# Patient Record
Sex: Male | Born: 1950 | Race: White | Hispanic: No | Marital: Married | Smoking: Never smoker
Health system: Southern US, Community
[De-identification: ages and names within clinical notes are randomized; demographics above are authoritative.]

## PROBLEM LIST (undated history)

## (undated) DIAGNOSIS — M779 Enthesopathy, unspecified: Secondary | ICD-10-CM

## (undated) DIAGNOSIS — I10 Essential (primary) hypertension: Secondary | ICD-10-CM

## (undated) DIAGNOSIS — K469 Unspecified abdominal hernia without obstruction or gangrene: Secondary | ICD-10-CM

## (undated) DIAGNOSIS — E785 Hyperlipidemia, unspecified: Secondary | ICD-10-CM

## (undated) DIAGNOSIS — R972 Elevated prostate specific antigen [PSA]: Secondary | ICD-10-CM

## (undated) DIAGNOSIS — T7840XA Allergy, unspecified, initial encounter: Secondary | ICD-10-CM

## (undated) HISTORY — PX: TONSILLECTOMY AND ADENOIDECTOMY: SUR1326

## (undated) HISTORY — PX: KNEE SURGERY: SHX244

## (undated) HISTORY — DX: Hyperlipidemia, unspecified: E78.5

## (undated) HISTORY — DX: Unspecified abdominal hernia without obstruction or gangrene: K46.9

## (undated) HISTORY — PX: KNEE ARTHROSCOPY: SUR90

## (undated) HISTORY — DX: Allergy, unspecified, initial encounter: T78.40XA

## (undated) HISTORY — DX: Elevated prostate specific antigen (PSA): R97.20

## (undated) HISTORY — DX: Enthesopathy, unspecified: M77.9

## (undated) HISTORY — DX: Essential (primary) hypertension: I10

---

## 1995-03-26 DIAGNOSIS — I10 Essential (primary) hypertension: Secondary | ICD-10-CM | POA: Insufficient documentation

## 2005-03-02 ENCOUNTER — Inpatient Hospital Stay (HOSPITAL_COMMUNITY): Admission: RE | Admit: 2005-03-02 | Discharge: 2005-03-03 | Payer: Self-pay | Admitting: Neurosurgery

## 2005-05-27 ENCOUNTER — Encounter: Admission: RE | Admit: 2005-05-27 | Discharge: 2005-05-27 | Payer: Self-pay | Admitting: Neurosurgery

## 2005-09-15 DIAGNOSIS — E7800 Pure hypercholesterolemia, unspecified: Secondary | ICD-10-CM | POA: Insufficient documentation

## 2006-03-16 HISTORY — PX: BACK SURGERY: SHX140

## 2006-04-29 ENCOUNTER — Ambulatory Visit: Payer: Self-pay | Admitting: Internal Medicine

## 2006-10-29 IMAGING — RF DG LUMBAR SPINE 2-3V
1 series · 2 of 2 positions shown · non-contrast
Comparison: none

CLINICAL DATA: Patient has undergone fusion. 
 LUMBAR SPINE (2-3 VIEWS): 
 Intraoperative views of the lumbar spine show posterior pedicle screw plate fixation L5-S1.  Hardware is well positioned.  On the lateral projection, interbody cages at the L5-S1 level appear well positioned.

[Series 1: run · 2 of 2 slices shown]
[im 1/2]
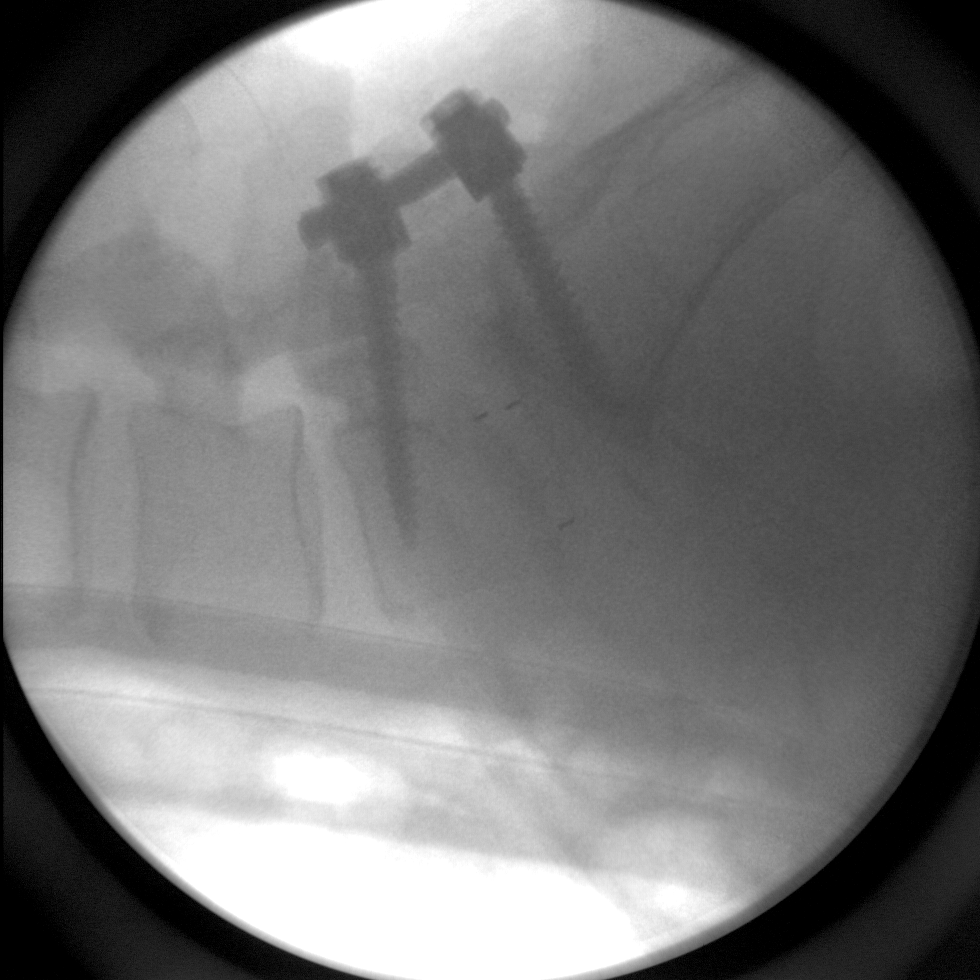
[im 2/2]
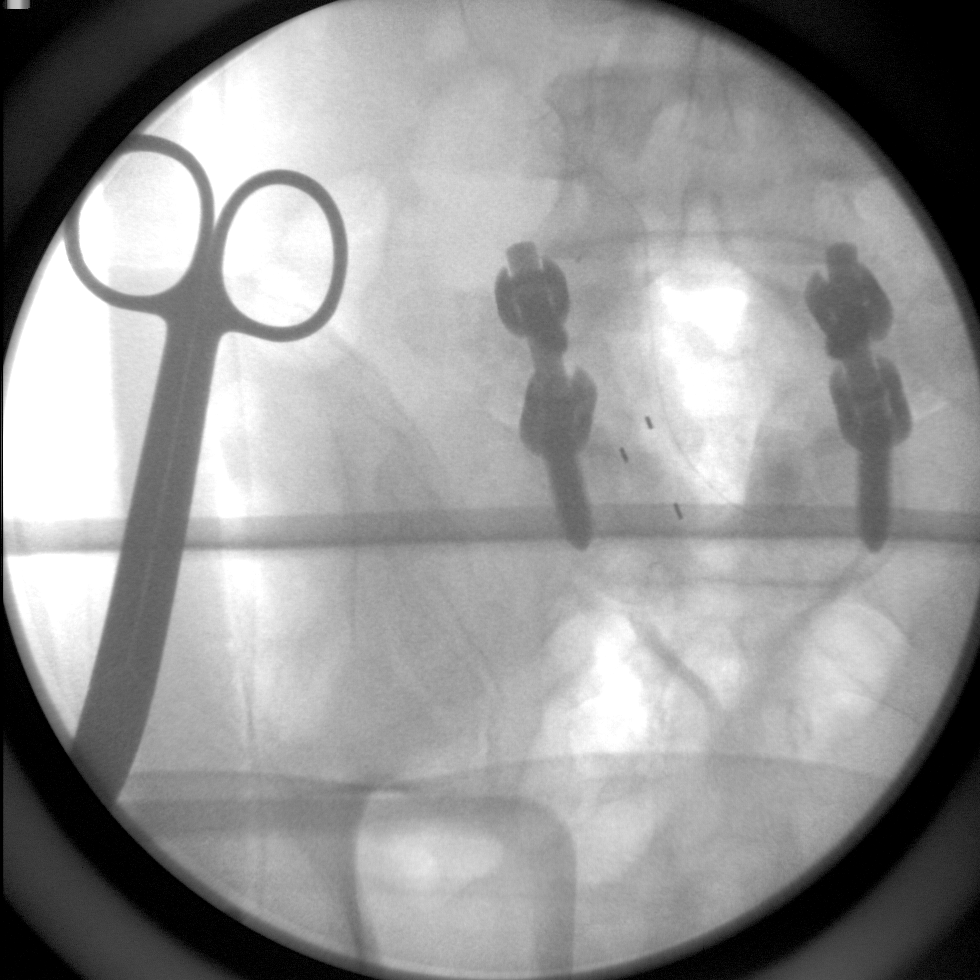

[2 of 2 positions shown; findings below may reference images not displayed]

IMPRESSION: Fusion L5-S1.  Hardware well positioned.

## 2009-04-10 ENCOUNTER — Ambulatory Visit: Payer: Self-pay | Admitting: Family Medicine

## 2012-03-16 DIAGNOSIS — K469 Unspecified abdominal hernia without obstruction or gangrene: Secondary | ICD-10-CM

## 2012-03-16 HISTORY — DX: Unspecified abdominal hernia without obstruction or gangrene: K46.9

## 2012-06-29 ENCOUNTER — Telehealth: Payer: Self-pay | Admitting: *Deleted

## 2012-06-29 DIAGNOSIS — Z1211 Encounter for screening for malignant neoplasm of colon: Secondary | ICD-10-CM

## 2012-06-29 MED ORDER — POLYETHYLENE GLYCOL 3350 17 GM/SCOOP PO POWD: ORAL | Status: DC

## 2012-06-29 NOTE — Telephone Encounter (Signed)
Patient was contacted today to arrange for colonoscopy and right inguinal hernia repair. This has been arranged at [facility] for 09-07-12 due to patient's request. He is aware of all instructions. Miralax prescription has been sent to the patient's pharmacy.

## 2012-08-24 ENCOUNTER — Other Ambulatory Visit: Payer: Self-pay | Admitting: General Surgery

## 2012-08-24 DIAGNOSIS — Z1211 Encounter for screening for malignant neoplasm of colon: Secondary | ICD-10-CM

## 2012-08-24 DIAGNOSIS — K4090 Unilateral inguinal hernia, without obstruction or gangrene, not specified as recurrent: Secondary | ICD-10-CM

## 2012-08-31 ENCOUNTER — Ambulatory Visit (INDEPENDENT_AMBULATORY_CARE_PROVIDER_SITE_OTHER): Payer: BC Managed Care – PPO | Admitting: General Surgery

## 2012-08-31 ENCOUNTER — Encounter: Payer: Self-pay | Admitting: General Surgery

## 2012-08-31 VITALS — BP 158/82 | HR 68 | Resp 12 | Ht 69.0 in | Wt 190.0 lb

## 2012-08-31 DIAGNOSIS — K4090 Unilateral inguinal hernia, without obstruction or gangrene, not specified as recurrent: Secondary | ICD-10-CM

## 2012-08-31 DIAGNOSIS — Z1211 Encounter for screening for malignant neoplasm of colon: Secondary | ICD-10-CM

## 2012-08-31 NOTE — Progress Notes (Signed)
Patient ID: MARTON MALIZIA, male   DOB: 05/31/1950, 62 y.o.   MRN: [id number]  Chief Complaint  Patient presents with  . Pre-op Exam    colonoscopy    HPI Warren Jackson is a 62 y.o. male who presents for a pre-op for planned inguinal hernia and colonoscopy evaluation. The patient states no problems with his bowels. No known family history of colon cancer or polyps.  Marland KitchenHPI  Past Medical History  Diagnosis Date  . Hyperlipidemia   . Bone spur   . Hernia 2014    Past Surgical History  Procedure Laterality Date  . Knee surgery Left [id number]  . Back surgery  2008    History reviewed. No pertinent family history.  Social History History  Substance Use Topics  . Smoking status: Never Smoker   . Smokeless tobacco: Not on file  . Alcohol Use: Yes    Allergies  Allergen Reactions  . Penicillins Rash    Current Outpatient Prescriptions  Medication Sig Dispense Refill  . ADVICOR 1000-20 MG 24 hr tablet Take 1 tablet by mouth daily.      Marland Kitchen aspirin 81 MG tablet Take 81 mg by mouth daily.      . DiphenhydrAMINE HCl, Sleep, (NIGHTTIME SLEEP AID) 50 MG CAPS Take 1 capsule by mouth at bedtime as needed.      Marland Kitchen lisinopril-hydrochlorothiazide (PRINZIDE,ZESTORETIC) 10-12.5 MG per tablet Take 1 tablet by mouth daily.      . metoprolol succinate (TOPROL-XL) 50 MG 24 hr tablet Take 0.5 tablets by mouth daily.      . Multiple Vitamin (MULTIVITAMIN) tablet Take 1 tablet by mouth daily.      . ranitidine (ZANTAC) 150 MG capsule Take 150 mg by mouth 2 (two) times daily.       No current facility-administered medications for this visit.    Review of Systems Review of Systems  Constitutional: Negative.   Respiratory: Negative.   Cardiovascular: Negative.   Gastrointestinal: Negative.     Blood pressure 158/82, pulse 68, resp. rate 12, height 5\' 9"  (1.753 m), weight 190 lb (86.183 kg).  Physical Exam Physical Exam  Constitutional: He appears well-developed and well-nourished.   Neck: Trachea normal. No mass and no thyromegaly present.  Cardiovascular: Normal rate, regular rhythm, normal heart sounds and normal pulses.   No murmur heard. Pulmonary/Chest: Effort normal and breath sounds normal.  Abdominal: A hernia is present. Hernia confirmed positive in the right inguinal area.  Neurological: He is alert.  Skin: Skin is warm and dry.    Data Reviewed Laboratory data from 12/19/2011 is unremarkable.  Assessment    Symptomatic right inguinal hernia.  Candidate for screening colonoscopy.    Plan    Surgery for repair of his right inguinal hernia and screening colonoscopy is scheduled for 09/07/2012. The indications for the procedure has been reviewed with the patient. The risks and benefits of colonoscopy including those related to bleeding and perforation have been reviewed. The plan for prosthetic mesh placement has been discussed.       Earline Mayotte 09/01/2012, 12:09 PM

## 2012-08-31 NOTE — Patient Instructions (Addendum)
Patient to have hernia repair as well as screening colonoscopy.    Colonoscopy A colonoscopy is an exam to evaluate your entire colon. In this exam, your colon is cleansed. A long fiberoptic tube is inserted through your rectum and into your colon. The fiberoptic scope (endoscope) is a long bundle of enclosed and very flexible fibers. These fibers transmit light to the area examined and send images from that area to your caregiver. Discomfort is usually minimal. You may be given a drug to help you sleep (sedative) during or prior to the procedure. This exam helps to detect lumps (tumors), polyps, inflammation, and areas of bleeding. Your caregiver may also take a small piece of tissue (biopsy) that will be examined under a microscope. LET YOUR CAREGIVER KNOW ABOUT:   Allergies to food or medicine.  Medicines taken, including vitamins, herbs, eyedrops, over-the-counter medicines, and creams.  Use of steroids (by mouth or creams).  Previous problems with anesthetics or numbing medicines.  History of bleeding problems or blood clots.  Previous surgery.  Other health problems, including diabetes and kidney problems.  Possibility of pregnancy, if this applies. BEFORE THE PROCEDURE   A clear liquid diet may be required for 2 days before the exam.  Ask your caregiver about changing or stopping your regular medications.  Liquid injections (enemas) or laxatives may be required.  A large amount of electrolyte solution may be given to you to drink over a short period of time. This solution is used to clean out your colon.  You should be present 60 minutes prior to your procedure or as directed by your caregiver. AFTER THE PROCEDURE   If you received a sedative or pain relieving medication, you will need to arrange for someone to drive you home.  Occasionally, there is a little blood passed with the first bowel movement. Do not be concerned. FINDING OUT THE RESULTS OF YOUR TEST Not all test  results are available during your visit. If your test results are not back during the visit, make an appointment with your caregiver to find out the results. Do not assume everything is normal if you have not heard from your caregiver or the medical facility. It is important for you to follow up on all of your test results. HOME CARE INSTRUCTIONS   It is not unusual to pass moderate amounts of gas and experience mild abdominal cramping following the procedure. This is due to air being used to inflate your colon during the exam. Walking or a warm pack on your belly (abdomen) may help.  You may resume all normal meals and activities after sedatives and medicines have worn off.  Only take over-the-counter or prescription medicines for pain, discomfort, or fever as directed by your caregiver. Do not use aspirin or blood thinners if a biopsy was taken. Consult your caregiver for medicine usage if biopsies were taken. SEEK IMMEDIATE MEDICAL CARE IF:   You have a fever.  You pass large blood clots or fill a toilet with blood following the procedure. This may also occur 10 to 14 days following the procedure. This is more likely if a biopsy was taken.  You develop abdominal pain that keeps getting worse and cannot be relieved with medicine. Document Released: 02/28/2000 Document Revised: 05/25/2011 Document Reviewed: 10/13/2007 [facility] Patient Information 2014 [place], [place].   Hernia, Surgical Repair A hernia occurs when an internal organ pushes out through a weak spot in the belly (abdominal) wall muscles. Hernias commonly occur in the groin and around  the navel. Hernias often can be pushed back into place (reduced). Most hernias tend to get worse over time. Problems occur when abdominal contents get stuck in the opening (incarcerated hernia). The blood supply gets cut off (strangulated hernia). This is an emergency and needs surgery. Otherwise, hernia repair can be an elective procedure. This means  you can schedule this at your convenience when an emergency is not present. Because complications can occur, if you decide to repair the hernia, it is best to do it soon. When it becomes an emergency procedure, there is increased risk of complications after surgery. CAUSES   Heavy lifting.  Obesity.  Prolonged coughing.  Straining to move your bowels.  Hernias can also occur through a cut (incision) by a surgeonafter an abdominal operation. HOME CARE INSTRUCTIONS Before the repair:  Bed rest is not required. You may continue your normal activities, but avoid heavy lifting (more than 10 pounds) or straining. Cough gently. If you are a smoker, it is best to stop. Even the best hernia repair can break down with the continual strain of coughing.  Do not wear anything tight over your hernia. Do not try to keep it in with an outside bandage or truss. These can damage abdominal contents if they are trapped in the hernia sac.  Eat a normal diet. Avoid constipation. Straining over long periods of time to have a bowel movement will increase hernia size. It also can breakdown repairs. If you cannot do this with diet alone, laxatives or stool softeners may be used. PRIOR TO SURGERY, SEEK IMMEDIATE MEDICAL CARE IF: You have problems (symptoms) of a trapped (incarcerated) hernia. Symptoms include:  An oral temperature above 102 F (38.9 C) develops, or as your caregiver suggests.  Increasing abdominal pain.  Feeling sick to your stomach(nausea) and vomiting.  You stop passing gas or stool.  The hernia is stuck outside the abdomen, looks discolored, feels hard, or is tender.  You have any changes in your bowel habits or in the hernia that is unusual for you. LET YOUR CAREGIVERS KNOW ABOUT THE FOLLOWING:  Allergies.  Medications taken including herbs, eye drops, over the counter medications, and creams.  Use of steroids (by mouth or creams).  Family or personal history of problems with  anesthetics or Novocaine.  Possibility of pregnancy, if this applies.  Personal history of blood clots (thrombophlebitis).  Family or personal history of bleeding or blood problems.  Previous surgery.  Other health problems. BEFORE THE PROCEDURE You should be present 1 hour prior to your procedure, or as directed by your caregiver.  AFTER THE PROCEDURE After surgery, you will be taken to the recovery area. A nurse will watch and check your progress there. Once you are awake, stable, and taking fluids well, you will be allowed to go home as long as there are no problems. Once home, an ice pack (wrapped in a light towel) applied to your operative site may help with discomfort. It may also keep the swelling down. Do not lift anything heavier than 10 pounds (4.55 kilograms). Take showers not baths. Do not drive while taking narcotics. Follow instructions as suggested by your caregiver.  SEEK IMMEDIATE MEDICAL CARE IF: After surgery:  There is redness, swelling, or increasing pain in the wound.  There is pus coming from the wound.  There is drainage from a wound lasting longer than 1 day.  An unexplained oral temperature above 102 F (38.9 C) develops.  You notice a foul smell coming from the  wound or dressing.  There is a breaking open of a wound (edged not staying together) after the sutures have been removed.  You notice increasing pain in the shoulders (shoulder strap areas).  You develop dizzy episodes or fainting while standing.  You develop persistent nausea or vomiting.  You develop a rash.  You have difficulty breathing.  You develop any reaction or side effects to medications given. MAKE SURE YOU:   Understand these instructions.  Will watch your condition.  Will get help right away if you are not doing well or get worse. Document Released: 08/26/2000 Document Revised: 05/25/2011 Document Reviewed: 07/19/2007 [facility] Patient Information 2014 [place],  [place].

## 2012-09-01 ENCOUNTER — Encounter: Payer: Self-pay | Admitting: General Surgery

## 2012-09-07 ENCOUNTER — Ambulatory Visit: Payer: Self-pay | Admitting: General Surgery

## 2012-09-07 DIAGNOSIS — Z1211 Encounter for screening for malignant neoplasm of colon: Secondary | ICD-10-CM

## 2012-09-07 DIAGNOSIS — K4090 Unilateral inguinal hernia, without obstruction or gangrene, not specified as recurrent: Secondary | ICD-10-CM

## 2012-09-08 ENCOUNTER — Ambulatory Visit (INDEPENDENT_AMBULATORY_CARE_PROVIDER_SITE_OTHER): Payer: BC Managed Care – PPO | Admitting: *Deleted

## 2012-09-08 ENCOUNTER — Encounter: Payer: Self-pay | Admitting: General Surgery

## 2012-09-08 DIAGNOSIS — K4090 Unilateral inguinal hernia, without obstruction or gangrene, not specified as recurrent: Secondary | ICD-10-CM

## 2012-09-08 NOTE — Progress Notes (Signed)
Removed bloody dressing from right inguinal area, steri strips in place.  No active visible bleeding noted. Swelling noted.  Telfa and Tegaderm reapplied.

## 2012-09-08 NOTE — Patient Instructions (Addendum)
Pt to use ice the rest of the day.  Aware to call for further questions or concerns. Follow up next week as scheduled.

## 2012-09-14 ENCOUNTER — Ambulatory Visit (INDEPENDENT_AMBULATORY_CARE_PROVIDER_SITE_OTHER): Payer: BC Managed Care – PPO | Admitting: General Surgery

## 2012-09-14 ENCOUNTER — Encounter: Payer: Self-pay | Admitting: General Surgery

## 2012-09-14 VITALS — BP 138/84 | HR 60 | Resp 12 | Ht 69.0 in | Wt 190.0 lb

## 2012-09-14 DIAGNOSIS — K4090 Unilateral inguinal hernia, without obstruction or gangrene, not specified as recurrent: Secondary | ICD-10-CM

## 2012-09-14 NOTE — Patient Instructions (Addendum)
The patient is aware to call back for any questions or concerns.  

## 2012-09-14 NOTE — Progress Notes (Signed)
Patient ID: Warren Jackson, male   DOB: 07/26/1950, 62 y.o.   MRN: [id number] Patient here today for post op visit from colonoscopy and right inguinal hernia repair on 09-07-12.  Mild skin irrigation/redness from Telfa. States he is "doing well", still little tenderness and puffiness.  The colonoscopy was normal.  Hernia exam showed a right indirect hernia. This was repaired with a large Ultra Pro mesh.  The patient has developed some redness at the site of a non-adherent dressing applied last week. This appears to be a reaction to the Telfa pad other than the Tegaderm.  The patient has no history of latex allergy.  Examination the standing position shows no weakness the right groin.  He may resume activities as he is comfortable. He may make use of hydrocortisone cream to the area if desired (he reports minimal pruritus at this time).  A follow up colonoscopy will be appropriate in 10 years.

## 2013-01-19 ENCOUNTER — Other Ambulatory Visit: Payer: Self-pay

## 2013-10-20 LAB — CBC AND DIFFERENTIAL
HCT: 46 % (ref 41–53)
HEMOGLOBIN: 16.1 g/dL (ref 13.5–17.5)
Neutrophils Absolute: 57 /uL
Platelets: 222 10*3/uL (ref 150–399)
WBC: 8 10^3/mL

## 2014-07-06 NOTE — Op Note (Signed)
PATIENT NAME:  Warren Jackson, Warren Jackson MR#:  [id number] DATE OF BIRTH:  1950/05/20  DATE OF PROCEDURE:  09/07/2012  PREOPERATIVE DIAGNOSIS: Symptomatic right inguinal hernia.   POSTOPERATIVE DIAGNOSIS: Symptomatic right inguinal hernia.  OPERATIVE PROCEDURE: Repair of right indirect inguinal hernia with large UltraPro Mesh.   OPERATING SURGEON: Hervey Ard, MD   ANESTHESIA: General by LMA under Dr. Ronelle Nigh, Marcaine 0.5% with 1:200,000 units of epinephrine, 30 mL local infiltration, Toradol 30 mg.   ESTIMATED BLOOD LOSS: Minimal.   CLINICAL NOTE: This 64 year old male has developed a symptomatic right inguinal hernia. He is felt to be a candidate for elective repair. He underwent a colonoscopy earlier today that was normal. He received Kefzol prior to the procedure.   OPERATIVE NOTE: With the patient under adequate general anesthesia and hair previously removed with clippers, the site was prepped with ChloraPrep and draped. Field block anesthesia was established for postoperative analgesia. A 5 cm skin line incision along the anticipated course of the inguinal canal was carried down through the skin and subcutaneous tissue with hemostasis achieved by electrocautery and 3-0 Vicryl ties. The external oblique was opened in the direction of its fibers. The ilioinguinal, iliohypogastric and genitofemoral nerves were identified and protected. The external spermatic fascia was opened and a large indirect sac identified. This was dissected free of the adjacent cord structures and the preperitoneal pocket cleared. A large UltraPro mesh was smoothed into the preperitoneal space and the external component laid along the floor of the inguinal canal. This was anchored to the pubic tubercle with an 0 Surgilon suture. Interrupted 0 Surgilons were used to anchor the inferior aspect of the mesh to the inguinal ligament in interrupted fashion. The medial and superior borders of the mesh were anchored to the transversalis  aponeurosis. A lateral cord slit was closed. The external oblique was closed over the wound with a running 2-0 Vicryl. Toradol was placed into the wound prior to this layer closure. Scarpa's fascia was closed with running 3-0 Vicryl and the skin closed with running 4-0 Vicryl subcuticular suture. Benzoin, Steri-Strips, Telfa, and Tegaderm dressing was then applied.  The patient tolerated the procedure well and was taken to the recovery room in stable condition.   ____________________________ Robert Bellow, MD jwb:cb D: 09/07/2012 14:50:53 ET T: 09/07/2012 20:44:22 ET JOB#: [id number]  cc: Robert Bellow, MD, <Dictator> Vickki Muff. Chrismon, PA Inara Dike Amedeo Kinsman MD ELECTRONICALLY SIGNED 09/08/2012 9:25

## 2014-07-16 ENCOUNTER — Encounter: Payer: Self-pay | Admitting: Podiatry

## 2014-07-16 ENCOUNTER — Ambulatory Visit (INDEPENDENT_AMBULATORY_CARE_PROVIDER_SITE_OTHER): Payer: BC Managed Care – PPO

## 2014-07-16 ENCOUNTER — Ambulatory Visit (INDEPENDENT_AMBULATORY_CARE_PROVIDER_SITE_OTHER): Payer: BC Managed Care – PPO | Admitting: Podiatry

## 2014-07-16 VITALS — BP 164/98 | HR 57 | Resp 16 | Ht 69.0 in | Wt 196.0 lb

## 2014-07-16 DIAGNOSIS — Q828 Other specified congenital malformations of skin: Secondary | ICD-10-CM | POA: Diagnosis not present

## 2014-07-16 DIAGNOSIS — M779 Enthesopathy, unspecified: Secondary | ICD-10-CM

## 2014-07-16 DIAGNOSIS — D3610 Benign neoplasm of peripheral nerves and autonomic nervous system, unspecified: Secondary | ICD-10-CM

## 2014-07-16 NOTE — Progress Notes (Signed)
   Subjective:    Patient ID: Warren Jackson, male    DOB: Jul 22, 1950, 64 y.o.   MRN: [id number]  HPI Both feet are painful , been here years ago , the right foot has a callus on the 5th met ,i have pain in the ball of the left foot, from 3 met to 5th met   Review of Systems  All other systems reviewed and are negative.      Objective:   Physical Exam: I have reviewed his past medical history medications allergies surgery social history and review of systems. Pulses are strongly palpable right foot. Neurologic sensorium is intact percent lasting monofilament however he does have a palpable Mulder's click to the third interdigital space of the left foot. Deep tendon reflexes are intact and muscle strength +5 over 5 dorsiflexion plantar flexors and inverters everters all intrinsic musculature is intact. Orthopedic evaluation of his strength all joints distal to the ankle for range of motion with the exception of the first metatarsophalangeal joint right greater than left. 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 Cutaneous evaluation demonstrates reactive hyperkeratosis some fifth metatarsal head of the resulting in porokeratotic lesions.        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  Plan: We discussed the etiology pathology conservative versus surgical therapies at this point I performed a debridement of all reactive hyperkeratosis. We discussed the need for at the very least injection therapy for the neuroma and he declined. We also discussed whether or not orthotics would be a good benefit and we decided against that. We did discuss appropriate shoe  gear stretching exercises and ice therapy as well as the need for surgical intervention regarding the first metatarsophalangeal joint at some point.

## 2015-02-09 ENCOUNTER — Other Ambulatory Visit: Payer: Self-pay | Admitting: Family Medicine

## 2015-04-02 ENCOUNTER — Other Ambulatory Visit: Payer: Self-pay | Admitting: Family Medicine

## 2015-04-03 NOTE — Telephone Encounter (Signed)
Warren Jackson patient.   Last refill: 03/02/2014 with 3 refills  Last OV: 10/20/2013

## 2015-04-04 ENCOUNTER — Other Ambulatory Visit: Payer: Self-pay | Admitting: Family Medicine

## 2015-04-04 NOTE — Telephone Encounter (Signed)
Rx request has already been sent to provider for this medication.

## 2015-04-04 NOTE — Telephone Encounter (Signed)
Pt needs refill lovastatin (MEVACOR) 20 MG tablet.  [facility] is completely [facility].  [facility]    Call back is [phone]  Thanks Con Memos

## 2015-04-04 NOTE — Telephone Encounter (Signed)
Patient scheduled for a follow up appointment on 04/11/2015, but he will be out of Lovastatin before appointment. Patient requested a refill be sent to [facility]

## 2015-04-05 ENCOUNTER — Other Ambulatory Visit: Payer: Self-pay | Admitting: Family Medicine

## 2015-04-05 NOTE — Telephone Encounter (Signed)
Pt states he needs to get the Rx for lovastatin (MEVACOR) 20 MG tablet today.  Pt states he is completely out and is request atleast a weeks worth sent to [place].  CB#[phone]/MW

## 2015-04-05 NOTE — Telephone Encounter (Signed)
Has ov with Vernie Murders 04/11/2015.

## 2015-04-08 DIAGNOSIS — G4730 Sleep apnea, unspecified: Secondary | ICD-10-CM | POA: Insufficient documentation

## 2015-04-08 DIAGNOSIS — N400 Enlarged prostate without lower urinary tract symptoms: Secondary | ICD-10-CM | POA: Insufficient documentation

## 2015-04-08 DIAGNOSIS — K4090 Unilateral inguinal hernia, without obstruction or gangrene, not specified as recurrent: Secondary | ICD-10-CM | POA: Insufficient documentation

## 2015-04-08 DIAGNOSIS — K219 Gastro-esophageal reflux disease without esophagitis: Secondary | ICD-10-CM | POA: Insufficient documentation

## 2015-04-08 MED ORDER — LOVASTATIN 20 MG PO TABS: 20.0000 mg | ORAL_TABLET | Freq: Every day | ORAL | Status: DC

## 2015-04-11 ENCOUNTER — Ambulatory Visit: Payer: Self-pay | Admitting: Family Medicine

## 2015-04-18 ENCOUNTER — Encounter: Payer: Self-pay | Admitting: Family Medicine

## 2015-04-18 ENCOUNTER — Ambulatory Visit (INDEPENDENT_AMBULATORY_CARE_PROVIDER_SITE_OTHER): Payer: BC Managed Care – PPO | Admitting: Family Medicine

## 2015-04-18 ENCOUNTER — Other Ambulatory Visit: Payer: Self-pay | Admitting: Family Medicine

## 2015-04-18 VITALS — BP 150/70 | HR 58 | Temp 98.0°F | Resp 14 | Wt 181.4 lb

## 2015-04-18 DIAGNOSIS — I10 Essential (primary) hypertension: Secondary | ICD-10-CM | POA: Diagnosis not present

## 2015-04-18 DIAGNOSIS — E7800 Pure hypercholesterolemia, unspecified: Secondary | ICD-10-CM | POA: Diagnosis not present

## 2015-04-18 NOTE — Progress Notes (Signed)
Patient ID: Warren Jackson, male   DOB: June 11, 1950, 65 y.o.   MRN: [id number]   Patient: Warren Jackson Male    DOB: 1950/08/30   65 y.o.   MRN: [id number] Visit Date: 04/18/2015  Today's Provider: Vernie Murders, PA   Chief Complaint  Patient presents with  . Hyperlipidemia  . Hypertension  . Follow-up   Subjective:    HPI   Hypertension, follow-up:  BP Readings from Last 3 Encounters:  04/18/15 150/70  07/16/14 164/98  09/14/12 138/84    He was last seen for hypertension 1 years ago.  BP at that visit was 124/72. Management since that visit includes none .He reports excellent compliance with treatment. He is not having side effects.  He is exercising. He is adherent to low salt diet.   Outside blood pressures are 170's/90's. He is experiencing none.  Patient denies none.   Cardiovascular risk factors include advanced age (older than 63 for men, 33 for women) and male gender.  Use of agents associated with hypertension: none.   ------------------------------------------------------------------------    Lipid/Cholesterol, Follow-up:   Last seen for this 1 years ago.  Management since that visit includes none.  Last Lipid Panel: No results found for: CHOL, TRIG, HDL, CHOLHDL, VLDL, LDLCALC, LDLDIRECT  He reports excellent compliance with treatment. He is not having side effects.   Wt Readings from Last 3 Encounters:  04/18/15 181 lb 6.4 oz (82.283 kg)  07/16/14 196 lb (88.905 kg)  09/14/12 190 lb (86.183 kg)    ------------------------------------------------------------------------ Patient Active Problem List   Diagnosis Date Noted  . Acid reflux 04/08/2015  . Enlarged prostate 04/08/2015  . Hernia, inguinal, right 04/08/2015  . Apnea, sleep 04/08/2015  . Encounter for screening colonoscopy 08/31/2012  . Inguinal hernia 08/31/2012  . Hypercholesterolemia without hypertriglyceridemia 09/15/2005  . Essential (primary) hypertension 03/26/1995   Past  Surgical History  Procedure Laterality Date  . Knee surgery Left [id number]  . Back surgery  2008  . Tonsillectomy and adenoidectomy    . Knee arthroscopy Left    Family History  Problem Relation Age of Onset  . Peptic Ulcer Disease Mother   . Heart attack Father   . Healthy Daughter   . Dementia Maternal Grandmother   . Heart attack Paternal Grandmother   . CVA Paternal Grandmother   . Alcohol abuse Paternal Grandfather    Allergies  Allergen Reactions  . Other     Telfa caused redness  . Penicillins Rash     Previous Medications   ASPIRIN 81 MG TABLET    Take 81 mg by mouth daily.   DIPHENHYDRAMINE HCL, SLEEP, (NIGHTTIME SLEEP AID) 50 MG CAPS    Take 1 capsule by mouth at bedtime as needed.   LISINOPRIL-HYDROCHLOROTHIAZIDE (PRINZIDE,ZESTORETIC) 10-12.5 MG PER TABLET    Take 1 tablet by mouth daily.   LOVASTATIN (MEVACOR) 20 MG TABLET    Take 1 tablet (20 mg total) by mouth daily.   METOPROLOL TARTRATE (LOPRESSOR) 25 MG TABLET    Take by mouth.   NAPROXEN SODIUM (ANAPROX) 220 MG TABLET    Take 220 mg by mouth as needed.   RANITIDINE (ZANTAC) 150 MG CAPSULE    Take 150 mg by mouth 2 (two) times daily.    Review of Systems  Constitutional: Negative.   HENT: Negative.   Eyes: Negative.   Respiratory: Negative.   Cardiovascular: Negative.   Gastrointestinal: Negative.   Endocrine: Negative.   Genitourinary: Negative.   Musculoskeletal: Negative.  Skin: Negative.   Allergic/Immunologic: Negative.   Neurological: Negative.   Hematological: Negative.   Psychiatric/Behavioral: Negative.     Social History  Substance Use Topics  . Smoking status: Never Smoker   . Smokeless tobacco: Not on file  . Alcohol Use: 0.0 oz/week    0 Standard drinks or equivalent per week   Objective:   BP 150/70 mmHg  Pulse 58  Temp(Src) 98 F (36.7 C) (Oral)  Resp 14  Wt 181 lb 6.4 oz (82.283 kg)  SpO2 97%  Physical Exam  Constitutional: He is oriented to person, place, and time.  He appears well-developed and well-nourished. No distress.  HENT:  Head: Normocephalic and atraumatic.  Right Ear: Hearing normal.  Left Ear: Hearing normal.  Nose: Nose normal.  Eyes: Conjunctivae and lids are normal. Right eye exhibits no discharge. Left eye exhibits no discharge. No scleral icterus.  Neck: Normal range of motion. Neck supple.  Cardiovascular: Normal rate, regular rhythm and normal heart sounds.   Pulmonary/Chest: Effort normal. No respiratory distress.  Abdominal: Soft. Bowel sounds are normal.  Musculoskeletal: Normal range of motion.  Neurological: He is alert and oriented to person, place, and time.  Skin: Skin is intact. No lesion and no rash noted.  Psychiatric: He has a normal mood and affect. His speech is normal and behavior is normal. Thought content normal.      Assessment & Plan:     1. Hypercholesterolemia without hypertriglyceridemia Tolerating Lovastatin without myalgias. Continues to exercise 4-6 days a week. Recommend he continue low fat diet and recheck labs. - COMPLETE METABOLIC PANEL WITH GFR - TSH - Lipid panel  2. Essential (primary) hypertension BP lower at home. Tolerating Metoprolol and Lisinopril/HCTZ without side effects. Will recheck labs and continue exercise level and low fat diet. Recheck pending lab reports. - CBC with Differential/Platelet

## 2015-04-19 LAB — COMPREHENSIVE METABOLIC PANEL
ALT: 8 IU/L (ref 0–44)
AST: 16 IU/L (ref 0–40)
Albumin/Globulin Ratio: 1.9 (ref 1.1–2.5)
Albumin: 4.2 g/dL (ref 3.6–4.8)
Alkaline Phosphatase: 55 IU/L (ref 39–117)
BUN/Creatinine Ratio: 25 — ABNORMAL HIGH (ref 10–22)
BUN: 24 mg/dL (ref 8–27)
Bilirubin Total: 0.6 mg/dL (ref 0.0–1.2)
CALCIUM: 9.4 mg/dL (ref 8.6–10.2)
CO2: 23 mmol/L (ref 18–29)
Chloride: 103 mmol/L (ref 96–106)
Creatinine, Ser: 0.95 mg/dL (ref 0.76–1.27)
GFR calc Af Amer: 98 mL/min/{1.73_m2} (ref >59)
GFR calc non Af Amer: 85 mL/min/{1.73_m2} (ref >59)
Globulin, Total: 2.2 g/dL (ref 1.5–4.5)
Glucose: 97 mg/dL (ref 65–99)
POTASSIUM: 4.2 mmol/L (ref 3.5–5.2)
Sodium: 142 mmol/L (ref 134–144)
Total Protein: 6.4 g/dL (ref 6.0–8.5)

## 2015-04-19 LAB — CBC WITH DIFFERENTIAL/PLATELET
BASOS: 0 %
Basophils Absolute: 0 10*3/uL (ref 0.0–0.2)
EOS (ABSOLUTE): 0.3 10*3/uL (ref 0.0–0.4)
Eos: 5 %
HEMOGLOBIN: 15.2 g/dL (ref 12.6–17.7)
Hematocrit: 42.9 % (ref 37.5–51.0)
IMMATURE GRANULOCYTES: 0 %
Immature Grans (Abs): 0 10*3/uL (ref 0.0–0.1)
Lymphocytes Absolute: 1.4 10*3/uL (ref 0.7–3.1)
Lymphs: 25 %
MCH: 29.2 pg (ref 26.6–33.0)
MCHC: 35.4 g/dL (ref 31.5–35.7)
MCV: 82 fL (ref 79–97)
MONOS ABS: 0.4 10*3/uL (ref 0.1–0.9)
Monocytes: 7 %
Neutrophils Absolute: 3.6 10*3/uL (ref 1.4–7.0)
Neutrophils: 63 %
Platelets: 210 10*3/uL (ref 150–379)
RBC: 5.21 x10E6/uL (ref 4.14–5.80)
RDW: 13.2 % (ref 12.3–15.4)
WBC: 5.8 10*3/uL (ref 3.4–10.8)

## 2015-04-19 LAB — LIPID PANEL
CHOL/HDL RATIO: 3.1 ratio (ref 0.0–5.0)
Cholesterol, Total: 140 mg/dL (ref 100–199)
HDL: 45 mg/dL (ref >39)
LDL Calculated: 81 mg/dL (ref 0–99)
Triglycerides: 71 mg/dL (ref 0–149)
VLDL Cholesterol Cal: 14 mg/dL (ref 5–40)

## 2015-04-19 LAB — TSH: TSH: 2.51 u[IU]/mL (ref 0.450–4.500)

## 2015-04-22 ENCOUNTER — Encounter: Payer: Self-pay | Admitting: Family Medicine

## 2015-04-22 ENCOUNTER — Ambulatory Visit (INDEPENDENT_AMBULATORY_CARE_PROVIDER_SITE_OTHER): Payer: BC Managed Care – PPO | Admitting: Family Medicine

## 2015-04-22 VITALS — BP 144/70 | HR 72 | Temp 98.6°F | Resp 16 | Wt 178.8 lb

## 2015-04-22 DIAGNOSIS — J101 Influenza due to other identified influenza virus with other respiratory manifestations: Secondary | ICD-10-CM

## 2015-04-22 DIAGNOSIS — J029 Acute pharyngitis, unspecified: Secondary | ICD-10-CM

## 2015-04-22 LAB — POCT INFLUENZA A/B
Influenza A, POC: POSITIVE — AB
Influenza B, POC: NEGATIVE

## 2015-04-22 LAB — POCT RAPID STREP A (OFFICE): Rapid Strep A Screen: NEGATIVE

## 2015-04-22 MED ORDER — OSELTAMIVIR PHOSPHATE 75 MG PO CAPS: 75.0000 mg | ORAL_CAPSULE | Freq: Two times a day (BID) | ORAL | Status: DC

## 2015-04-22 MED ORDER — HYDROCODONE-HOMATROPINE 5-1.5 MG/5ML PO SYRP: 5.0000 mL | ORAL_SOLUTION | Freq: Three times a day (TID) | ORAL | Status: DC | PRN

## 2015-04-22 NOTE — Patient Instructions (Signed)

## 2015-04-22 NOTE — Progress Notes (Signed)
Patient ID: Warren Jackson, male   DOB: 05/27/1950, 65 y.o.   MRN: [id number]   Patient: Warren Jackson Male    DOB: 1950/07/10   65 y.o.   MRN: [id number] Visit Date: 04/22/2015  Today's Provider: Vernie Murders, PA   Chief Complaint  Patient presents with  . URI   Subjective:    URI  This is a new problem. Episode onset: 04-20-15. The problem has been unchanged. The maximum temperature recorded prior to his arrival was 101 - 101.9 F. Associated symptoms include coughing, sneezing and a sore throat. Pertinent negatives include no congestion, headaches or sinus pain. Associated symptoms comments: fever. He has tried NSAIDs for the symptoms. The treatment provided no relief.   Patient Active Problem List   Diagnosis Date Noted  . Acid reflux 04/08/2015  . Enlarged prostate 04/08/2015  . Hernia, inguinal, right 04/08/2015  . Apnea, sleep 04/08/2015  . Encounter for screening colonoscopy 08/31/2012  . Inguinal hernia 08/31/2012  . Hypercholesterolemia without hypertriglyceridemia 09/15/2005  . Essential (primary) hypertension 03/26/1995   Past Surgical History  Procedure Laterality Date  . Knee surgery Left [id number]  . Back surgery  2008  . Tonsillectomy and adenoidectomy    . Knee arthroscopy Left    Family History  Problem Relation Age of Onset  . Peptic Ulcer Disease Mother   . Heart attack Father   . Healthy Daughter   . Dementia Maternal Grandmother   . Heart attack Paternal Grandmother   . CVA Paternal Grandmother   . Alcohol abuse Paternal Grandfather      Previous Medications   ASPIRIN 81 MG TABLET    Take 81 mg by mouth daily.   DIPHENHYDRAMINE HCL, SLEEP, (NIGHTTIME SLEEP AID) 50 MG CAPS    Take 1 capsule by mouth at bedtime as needed.   LISINOPRIL-HYDROCHLOROTHIAZIDE (PRINZIDE,ZESTORETIC) 10-12.5 MG PER TABLET    Take 1 tablet by mouth daily.   LOVASTATIN (MEVACOR) 20 MG TABLET    Take 1 tablet (20 mg total) by mouth daily.   METOPROLOL TARTRATE (LOPRESSOR) 25  MG TABLET    Take by mouth.   NAPROXEN SODIUM (ANAPROX) 220 MG TABLET    Take 220 mg by mouth as needed.   RANITIDINE (ZANTAC) 150 MG CAPSULE    Take 150 mg by mouth 2 (two) times daily.   Allergies  Allergen Reactions  . Other     Telfa caused redness  . Penicillins Rash    Review of Systems  Constitutional: Positive for fever.  HENT: Positive for sneezing and sore throat. Negative for congestion.   Eyes: Negative.   Respiratory: Positive for cough.   Cardiovascular: Negative.   Gastrointestinal: Negative.   Endocrine: Negative.   Genitourinary: Negative.   Musculoskeletal: Negative.   Skin: Negative.   Allergic/Immunologic: Negative.   Neurological: Negative.  Negative for headaches.  Hematological: Negative.   Psychiatric/Behavioral: Negative.     Social History  Substance Use Topics  . Smoking status: Never Smoker   . Smokeless tobacco: Not on file  . Alcohol Use: 0.0 oz/week    0 Standard drinks or equivalent per week   Objective:   BP 144/70 mmHg  Pulse 72  Temp(Src) 98.6 F (37 C) (Oral)  Resp 16  Wt 178 lb 12.8 oz (81.103 kg)  Physical Exam  Constitutional: He is oriented to person, place, and time. He appears well-developed and well-nourished. He appears distressed.  Cough, fever and sore throat.  HENT:  Head: Normocephalic.  Left Ear: External  ear normal.  Reddened posterior pharynx. No exudates.  Eyes: Conjunctivae and EOM are normal.  Neck: Normal range of motion.  Cardiovascular: Normal rate, regular rhythm and normal heart sounds.   Pulmonary/Chest: Effort normal and breath sounds normal.  Abdominal: Soft. Bowel sounds are normal.  Lymphadenopathy:    He has no cervical adenopathy.  Neurological: He is alert and oriented to person, place, and time.  Psychiatric: He has a normal mood and affect. His behavior is normal.      Assessment & Plan:     1. Pharyngitis Started 04-20-15 with fever and general malaise. Strep test negative but flu A  test positive. Gargle with warm saltwater prn and may use Tylenol or ASA prn. Increase fluid intake and recheck prn. - POCT rapid strep A  2. Influenza A Positive influenza A test with fever, cough and sore throat that started 04-20-15. May use Hycodan for cough and treat with Tamiflu. Recheck prn. - POCT Influenza A/B - oseltamivir (TAMIFLU) 75 MG capsule; Take 1 capsule (75 mg total) by mouth 2 (two) times daily.  Dispense: 10 capsule; Refill: 0 - HYDROcodone-homatropine (HYCODAN) 5-1.5 MG/5ML syrup; Take 5 mLs by mouth every 8 (eight) hours as needed for cough.  Dispense: 120 mL; Refill: 0

## 2015-05-09 ENCOUNTER — Other Ambulatory Visit: Payer: Self-pay | Admitting: Family Medicine

## 2015-08-06 ENCOUNTER — Other Ambulatory Visit: Payer: Self-pay | Admitting: Family Medicine

## 2015-08-08 ENCOUNTER — Other Ambulatory Visit: Payer: Self-pay | Admitting: Family Medicine

## 2015-08-08 MED ORDER — LOVASTATIN 20 MG PO TABS: 20.0000 mg | ORAL_TABLET | Freq: Every day | ORAL | Status: DC

## 2015-08-08 NOTE — Telephone Encounter (Signed)
Pt contacted office for refill request on the following medications: lovastatin (MEVACOR) 20 MG tablet. 90 day supply. [facility].  CB#[phone]/MW

## 2015-08-12 ENCOUNTER — Other Ambulatory Visit: Payer: Self-pay | Admitting: Family Medicine

## 2015-08-15 NOTE — Telephone Encounter (Signed)
Pt contacted office for refill request on the following medications:  lisinopril-hydrochlorothiazide (PRINZIDE,ZESTORETIC) 10-12.5 MG per tablet.  90 day supply.  [facility].  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

## 2015-11-11 ENCOUNTER — Other Ambulatory Visit: Payer: Self-pay | Admitting: Family Medicine

## 2016-02-05 ENCOUNTER — Other Ambulatory Visit: Payer: Self-pay | Admitting: Family Medicine

## 2016-05-05 ENCOUNTER — Other Ambulatory Visit: Payer: Self-pay | Admitting: Family Medicine

## 2016-08-02 ENCOUNTER — Other Ambulatory Visit: Payer: Self-pay | Admitting: Family Medicine

## 2016-08-03 NOTE — Telephone Encounter (Signed)
Warren Jackson's pt. Last FU 04/18/2015. Renaldo Fiddler, CMA

## 2016-09-25 ENCOUNTER — Encounter: Payer: Self-pay | Admitting: Family Medicine

## 2016-09-25 ENCOUNTER — Ambulatory Visit
Admission: RE | Admit: 2016-09-25 | Discharge: 2016-09-25 | Disposition: A | Discharge status: Home / Self Care | Payer: BC Managed Care – PPO | Source: Ambulatory Visit | Attending: Family Medicine | Admitting: Family Medicine

## 2016-09-25 ENCOUNTER — Ambulatory Visit (INDEPENDENT_AMBULATORY_CARE_PROVIDER_SITE_OTHER): Payer: BC Managed Care – PPO | Admitting: Family Medicine

## 2016-09-25 VITALS — BP 136/88 | HR 59 | Temp 98.3°F | Wt 179.0 lb

## 2016-09-25 DIAGNOSIS — M79642 Pain in left hand: Secondary | ICD-10-CM | POA: Insufficient documentation

## 2016-09-25 DIAGNOSIS — I10 Essential (primary) hypertension: Secondary | ICD-10-CM

## 2016-09-25 DIAGNOSIS — E7800 Pure hypercholesterolemia, unspecified: Secondary | ICD-10-CM | POA: Diagnosis not present

## 2016-09-25 DIAGNOSIS — G8929 Other chronic pain: Secondary | ICD-10-CM | POA: Diagnosis not present

## 2016-09-25 DIAGNOSIS — M19042 Primary osteoarthritis, left hand: Secondary | ICD-10-CM | POA: Diagnosis not present

## 2016-09-25 DIAGNOSIS — M25512 Pain in left shoulder: Secondary | ICD-10-CM

## 2016-09-25 NOTE — Progress Notes (Addendum)
Patient: Warren Jackson Male    DOB: 1951-01-29   66 y.o.   MRN: [id number] Visit Date: 09/25/2016  Today's Provider: Vernie Murders, PA   Chief Complaint  Patient presents with  . Shoulder Pain  . Hand Pain   Subjective:    Arm Pain   The incident occurred more than 1 week ago. There was no injury mechanism. The pain is present in the left shoulder and left hand. Quality: acute pain that comes with certain movement. The pain has been intermittent since the incident. Exacerbated by: certain movements. Treatments tried: Tylenol Arthritis. The treatment provided no relief.   Patient Active Problem List   Diagnosis Date Noted  . Acid reflux 04/08/2015  . Enlarged prostate 04/08/2015  . Hernia, inguinal, right 04/08/2015  . Apnea, sleep 04/08/2015  . Encounter for screening colonoscopy 08/31/2012  . Inguinal hernia 08/31/2012  . Hypercholesterolemia without hypertriglyceridemia 09/15/2005  . Essential (primary) hypertension 03/26/1995   Past Surgical History:  Procedure Laterality Date  . BACK SURGERY  2008  . KNEE ARTHROSCOPY Left   . KNEE SURGERY Left [id number]  . TONSILLECTOMY AND ADENOIDECTOMY     Family History  Problem Relation Age of Onset  . Heart attack Father   . Healthy Daughter   . Dementia Maternal Grandmother   . Heart attack Paternal Grandmother   . CVA Paternal Grandmother   . Alcohol abuse Paternal Grandfather   . Peptic Ulcer Disease Mother   . Cancer Mother    Allergies  Allergen Reactions  . Other     Telfa caused redness  . Penicillins Rash    Previous Medications   ASPIRIN 81 MG TABLET    Take 81 mg by mouth daily.   DIPHENHYDRAMINE HCL, SLEEP, (NIGHTTIME SLEEP AID) 50 MG CAPS    Take 1 capsule by mouth at bedtime as needed.   LISINOPRIL-HYDROCHLOROTHIAZIDE (PRINZIDE,ZESTORETIC) 10-12.5 MG TABLET    TAKE 1 TABLET BY MOUTH DAILY   LOVASTATIN (MEVACOR) 20 MG TABLET    TAKE 1 TABLET (20 MG TOTAL) BY MOUTH AT BEDTIME.   METOPROLOL TARTRATE  (LOPRESSOR) 25 MG TABLET    Take by mouth.   RANITIDINE (ZANTAC) 150 MG CAPSULE    Take 150 mg by mouth 2 (two) times daily.    Review of Systems  Constitutional: Negative.   Respiratory: Negative.   Musculoskeletal: Positive for arthralgias and myalgias.    Social History  Substance Use Topics  . Smoking status: Never Smoker  . Smokeless tobacco: Not on file  . Alcohol use 0.0 oz/week   Objective:   BP 136/88 (BP Location: Right Arm, Patient Position: Sitting, Cuff Size: Normal)   Pulse (!) 59   Temp 98.3 F (36.8 C) (Oral)   Wt 179 lb (81.2 kg)   SpO2 98%   BMI 26.43 kg/m  Wt Readings from Last 3 Encounters:  09/25/16 179 lb (81.2 kg)  04/22/15 178 lb 12.8 oz (81.1 kg)  04/18/15 181 lb 6.4 oz (82.3 kg)   Pulse Readings from Last 3 Encounters:  09/25/16 (!) 59  04/22/15 72  04/18/15 (!) 58   Physical Exam  Constitutional: He is oriented to person, place, and time. He appears well-developed and well-nourished. No distress.  HENT:  Head: Normocephalic and atraumatic.  Right Ear: Hearing normal.  Left Ear: Hearing normal.  Nose: Nose normal.  Eyes: Conjunctivae and lids are normal. Right eye exhibits no discharge. Left eye exhibits no discharge. No scleral icterus.  Neck: Neck supple.  Cardiovascular:  Normal rate and regular rhythm.   Pulmonary/Chest: Effort normal and breath sounds normal. No respiratory distress.  Abdominal: Soft.  Musculoskeletal: He exhibits tenderness.  Pain in the left anterior shoulder to elevate humerus laterally to shoulder level and externally rotate or reach behind back. Enlargement of the first [facility] joint at the base of the left thumb. Some tenderness and pain with testing grip strength. Good pulses and no numbness.  Neurological: He is alert and oriented to person, place, and time.  Skin: Skin is intact. No lesion and no rash noted.  Psychiatric: He has a normal mood and affect. His speech is normal and behavior is normal. Thought content  normal.   Depression screen [facility] 2/9 09/25/2016  Decreased Interest 0  Down, Depressed, Hopeless 0  PHQ - 2 Score 0  Altered sleeping 1  Change in appetite 0  Feeling bad or failure about yourself  0  Trouble concentrating 0  Moving slowly or fidgety/restless 0  Suicidal thoughts 0  PHQ-9 Score 1  Difficult doing work/chores Not difficult at all      Assessment & Plan:     1. Chronic left shoulder pain Onset over the past 2-3 months without known injury. Seems to be worsening and no relief from Aspercreme. Suspect arthritis versus rotator cuff injury. Will get x-ray evaluation. May need to try NSAID orally or refer to an orthopedist. - DG Shoulder Left  2. Left hand pain No known injury but having discomfort with some decrease in grip strength. Will check x-ray. Suspect arthritis. Trying to stay off NSAID's because of past rebound headaches. Not much relief from Aspercreme applications. - DG Hand Complete Left  3. Essential (primary) hypertension Stable BP on the Lisinopril/HCTZ 10/12.5 mg qd with Metoprolol 25 mg qod. Still having a second or two of lightheaded sensation when he first stands from a seated position. Pulse usually in the low 50 to upper 40 range. Recommend he stop the Metoprolol and only use if any increase in BP or palpitations. Check labs for electrolyte imbalance or anemia. Recheck prn report. - CBC with Differential/Platelet - Comprehensive metabolic panel - Lipid panel - TSH  4. Hypercholesterolemia without hypertriglyceridemia Has been exercising regularly (walking 3-4 miles a day) and following low fat diet. Recheck labs and continue this regimen. - Comprehensive metabolic panel - Lipid panel - TSH

## 2016-09-26 ENCOUNTER — Encounter: Payer: Self-pay | Admitting: Family Medicine

## 2016-09-26 LAB — COMPREHENSIVE METABOLIC PANEL
ALBUMIN: 4.7 g/dL (ref 3.6–4.8)
ALK PHOS: 63 IU/L (ref 39–117)
ALT: 14 IU/L (ref 0–44)
AST: 20 IU/L (ref 0–40)
Albumin/Globulin Ratio: 2.1 (ref 1.2–2.2)
BUN / CREAT RATIO: 23 (ref 10–24)
BUN: 25 mg/dL (ref 8–27)
Bilirubin Total: 0.7 mg/dL (ref 0.0–1.2)
CO2: 27 mmol/L (ref 20–29)
CREATININE: 1.08 mg/dL (ref 0.76–1.27)
Calcium: 10 mg/dL (ref 8.6–10.2)
Chloride: 102 mmol/L (ref 96–106)
GFR, EST AFRICAN AMERICAN: 83 mL/min/{1.73_m2} (ref >59)
GFR, EST NON AFRICAN AMERICAN: 72 mL/min/{1.73_m2} (ref >59)
GLOBULIN, TOTAL: 2.2 g/dL (ref 1.5–4.5)
Glucose: 109 mg/dL — ABNORMAL HIGH (ref 65–99)
Potassium: 4.9 mmol/L (ref 3.5–5.2)
SODIUM: 141 mmol/L (ref 134–144)
TOTAL PROTEIN: 6.9 g/dL (ref 6.0–8.5)

## 2016-09-26 LAB — CBC WITH DIFFERENTIAL/PLATELET
Basophils Absolute: 0 10*3/uL (ref 0.0–0.2)
Basos: 0 %
EOS (ABSOLUTE): 0.3 10*3/uL (ref 0.0–0.4)
EOS: 4 %
HEMATOCRIT: 47.2 % (ref 37.5–51.0)
HEMOGLOBIN: 15.9 g/dL (ref 13.0–17.7)
Immature Grans (Abs): 0 10*3/uL (ref 0.0–0.1)
Immature Granulocytes: 0 %
LYMPHS ABS: 1.8 10*3/uL (ref 0.7–3.1)
Lymphs: 26 %
MCH: 29 pg (ref 26.6–33.0)
MCHC: 33.7 g/dL (ref 31.5–35.7)
MCV: 86 fL (ref 79–97)
MONOCYTES: 7 %
Monocytes Absolute: 0.5 10*3/uL (ref 0.1–0.9)
NEUTROS ABS: 4.3 10*3/uL (ref 1.4–7.0)
Neutrophils: 63 %
Platelets: 226 10*3/uL (ref 150–379)
RBC: 5.49 x10E6/uL (ref 4.14–5.80)
RDW: 13.9 % (ref 12.3–15.4)
WBC: 6.9 10*3/uL (ref 3.4–10.8)

## 2016-09-26 LAB — TSH: TSH: 4.46 u[IU]/mL (ref 0.450–4.500)

## 2016-09-26 LAB — LIPID PANEL
CHOL/HDL RATIO: 3.4 ratio (ref 0.0–5.0)
CHOLESTEROL TOTAL: 160 mg/dL (ref 100–199)
HDL: 47 mg/dL (ref >39)
LDL CALC: 94 mg/dL (ref 0–99)
Triglycerides: 95 mg/dL (ref 0–149)
VLDL Cholesterol Cal: 19 mg/dL (ref 5–40)

## 2016-09-28 ENCOUNTER — Encounter: Payer: Self-pay | Admitting: Family Medicine

## 2016-10-03 ENCOUNTER — Encounter: Payer: Self-pay | Admitting: Family Medicine

## 2016-10-31 ENCOUNTER — Other Ambulatory Visit: Payer: Self-pay | Admitting: Family Medicine

## 2017-02-25 ENCOUNTER — Ambulatory Visit: Payer: Medicare Other | Admitting: Family Medicine

## 2017-02-25 ENCOUNTER — Telehealth: Payer: Self-pay | Admitting: Family Medicine

## 2017-02-25 ENCOUNTER — Encounter: Payer: Self-pay | Admitting: Family Medicine

## 2017-02-25 VITALS — BP 142/98 | HR 74 | Temp 97.9°F | Resp 16 | Ht 69.0 in | Wt 184.0 lb

## 2017-02-25 DIAGNOSIS — J029 Acute pharyngitis, unspecified: Secondary | ICD-10-CM | POA: Diagnosis not present

## 2017-02-25 DIAGNOSIS — J069 Acute upper respiratory infection, unspecified: Secondary | ICD-10-CM | POA: Diagnosis not present

## 2017-02-25 LAB — POCT RAPID STREP A (OFFICE): RAPID STREP A SCREEN: NEGATIVE

## 2017-02-25 MED ORDER — AZITHROMYCIN 250 MG PO TABS: ORAL_TABLET | ORAL | 0 refills | Status: DC

## 2017-02-25 MED ORDER — FIRST-DUKES MOUTHWASH MT SUSP: OROMUCOSAL | 0 refills | Status: DC

## 2017-02-25 NOTE — Progress Notes (Signed)
Patient: Warren Jackson Male    DOB: 1950/10/03   66 y.o.   MRN: [id number] Visit Date: 02/25/2017  Today's Provider: Vernie Murders, PA   Chief Complaint  Patient presents with  . Ear Pain  . Sore Throat   Subjective:    URI   This is a new problem. Episode onset: Sunday. The problem has been unchanged. There has been no fever. Associated symptoms include ear pain and a sore throat. Treatments tried: Aleve, Tylenol,and gargled salt water. The treatment provided mild relief.   Past Medical History:  Diagnosis Date  . Bone spur   . Hernia 2014  . Hyperlipidemia    Past Surgical History:  Procedure Laterality Date  . BACK SURGERY  2008  . KNEE ARTHROSCOPY Left   . KNEE SURGERY Left [id number]  . TONSILLECTOMY AND ADENOIDECTOMY     Family History  Problem Relation Age of Onset  . Heart attack Father   . Healthy Daughter   . Dementia Maternal Grandmother   . Heart attack Paternal Grandmother   . CVA Paternal Grandmother   . Alcohol abuse Paternal Grandfather   . Peptic Ulcer Disease Mother   . Cancer Mother    Allergies  Allergen Reactions  . Other     Telfa caused redness  . Penicillins Rash    Current Outpatient Medications:  .  aspirin 81 MG tablet, Take 81 mg by mouth daily., Disp: , Rfl:  .  DiphenhydrAMINE HCl, Sleep, (NIGHTTIME SLEEP AID) 50 MG CAPS, Take 1 capsule by mouth at bedtime as needed., Disp: , Rfl:  .  lisinopril-hydrochlorothiazide (PRINZIDE,ZESTORETIC) 10-12.5 MG tablet, TAKE 1 TABLET BY MOUTH DAILY (Patient taking differently: TAKE 1 TABLET BY MOUTH every other day), Disp: 90 tablet, Rfl: 3 .  lovastatin (MEVACOR) 20 MG tablet, TAKE 1 TABLET BY MOUTH AT BEDTIME, Disp: 90 tablet, Rfl: 1 .  metoprolol tartrate (LOPRESSOR) 25 MG tablet, Take 50 mg by mouth. Take 25 mg daily, Disp: , Rfl:  .  ranitidine (ZANTAC) 150 MG capsule, Take 150 mg by mouth daily. , Disp: , Rfl:   Review of Systems  Constitutional: Positive for appetite change.    HENT: Positive for ear pain and sore throat.   Respiratory: Negative.   Cardiovascular: Negative.     Social History   Tobacco Use  . Smoking status: Never Smoker  . Smokeless tobacco: Never Used  Substance Use Topics  . Alcohol use: Yes    Alcohol/week: 0.0 oz   Objective:   BP (!) 142/98 (BP Location: Right Arm, Patient Position: Sitting, Cuff Size: Normal)   Pulse 74   Temp 97.9 F (36.6 C) (Oral)   Resp 16   Ht 5\' 9"  (1.753 m)   Wt 184 lb (83.5 kg)   SpO2 95%   BMI 27.17 kg/m  BP Readings from Last 3 Encounters:  02/25/17 (!) 142/98  09/25/16 136/88  04/22/15 (!) 144/70   Wt Readings from Last 3 Encounters:  02/25/17 184 lb (83.5 kg)  09/25/16 179 lb (81.2 kg)  04/22/15 178 lb 12.8 oz (81.1 kg)   Physical Exam  Constitutional: He is oriented to person, place, and time. He appears well-developed and well-nourished. No distress.  HENT:  Head: Normocephalic and atraumatic.  Right Ear: Hearing and external ear normal.  Left Ear: Hearing and external ear normal.  Nose: Nose normal.  Fiery red posterior pharynx and soft palate without exudates.  Eyes: Conjunctivae and lids are normal. Right eye exhibits no  discharge. Left eye exhibits no discharge. No scleral icterus.  Neck: Neck supple.  Cardiovascular: Normal rate and regular rhythm.  Pulmonary/Chest: Effort normal and breath sounds normal. No respiratory distress.  Musculoskeletal: Normal range of motion.  Neurological: He is alert and oriented to person, place, and time.  Skin: Skin is intact. No lesion and no rash noted.  Psychiatric: He has a normal mood and affect. His speech is normal and behavior is normal. Thought content normal.      Assessment & Plan:     1. Acute pharyngitis, unspecified etiology Onset 02-21-17 after going to a play with his daughter who was sick. No fever but some cough and earache. Strep test negative. Throat "fiery" red without exudates. Will treat with Azithromycin and Duke's  Mouthwash for discomfort. Recheck if no better in 5-7 days. - POCT rapid strep A - azithromycin (ZITHROMAX) 250 MG tablet; Take 2 tablets by mouth today then one daily for 4 days.  Dispense: 6 tablet; Refill: 0 - Diphenhyd-Hydrocort-Nystatin (FIRST-DUKES MOUTHWASH) SUSP; Gargle and swallow with one teaspoon after meals and at bedtime.  Dispense: 237 mL; Refill: 0  2. URI with cough and congestion Onset with sore throat and having some ear ache with cough. No wheezes, rales or rhonchi today. Continue using Robitussin-DM for cough and congestion. Increase fluids and may use Tylenol or Aleve prn aches or pains. Recheck prn. - POCT rapid strep A - azithromycin (ZITHROMAX) 250 MG tablet; Take 2 tablets by mouth today then one daily for 4 days.  Dispense: 6 tablet; Refill: [place], PA  [facility]

## 2017-02-25 NOTE — Patient Instructions (Signed)

## 2017-02-25 NOTE — Telephone Encounter (Signed)
Documented received confirmation from [facility] today.

## 2017-02-25 NOTE — Telephone Encounter (Signed)
Pt advised that he needed to let Lexine Baton know that he had his [facility]neum[facility] and he got his shingles vaccine @ [facility] in 2014 and Heron Nay will be faxing over the info of his shingles vaccine from 2014. Thanks TNP

## 2017-04-01 ENCOUNTER — Ambulatory Visit (INDEPENDENT_AMBULATORY_CARE_PROVIDER_SITE_OTHER): Payer: Medicare Other | Admitting: Family Medicine

## 2017-04-01 ENCOUNTER — Encounter: Payer: Self-pay | Admitting: Family Medicine

## 2017-04-01 VITALS — BP 112/78 | HR 50 | Temp 98.2°F | Ht 69.0 in | Wt 188.6 lb

## 2017-04-01 DIAGNOSIS — Z114 Encounter for screening for human immunodeficiency virus [HIV]: Secondary | ICD-10-CM | POA: Diagnosis not present

## 2017-04-01 DIAGNOSIS — Z0000 Encounter for general adult medical examination without abnormal findings: Secondary | ICD-10-CM

## 2017-04-01 DIAGNOSIS — E7800 Pure hypercholesterolemia, unspecified: Secondary | ICD-10-CM

## 2017-04-01 DIAGNOSIS — I10 Essential (primary) hypertension: Secondary | ICD-10-CM

## 2017-04-01 DIAGNOSIS — Z1159 Encounter for screening for other viral diseases: Secondary | ICD-10-CM | POA: Diagnosis not present

## 2017-04-01 DIAGNOSIS — Z125 Encounter for screening for malignant neoplasm of prostate: Secondary | ICD-10-CM

## 2017-04-01 NOTE — Progress Notes (Signed)
Patient: Warren Jackson, Male    DOB: 12-Jun-1950, 67 y.o.   MRN: [id number] Visit Date: 04/01/2017  Today's Provider: Vernie Murders, PA   Chief Complaint  Patient presents with  . Medicare Wellness   Subjective:   Initial preventative physical exam Warren Jackson is a 67 y.o. male who presents today for his Initial Preventative Physical Exam. He feels well. He reports exercising 4-6 days per week walking. He reports he is sleeping fairly well.  Review of Systems  Constitutional: Negative.   HENT: Negative.   Eyes: Negative.   Respiratory: Negative.   Cardiovascular: Negative.   Gastrointestinal: Negative.   Endocrine: Negative.   Genitourinary: Negative.   Musculoskeletal: Negative.   Skin: Negative.   Allergic/Immunologic: Negative.   Neurological: Negative.   Hematological: Negative.   Psychiatric/Behavioral: Negative.    Social History   Socioeconomic History  . Marital status: Married    Spouse name: Warren Jackson  . Number of children: One  . Years of education: Not on file  . Highest education level: Not on file  Social Needs  . Financial resource strain: Not on file  . Food insecurity - worry: Not on file  . Food insecurity - inability: Not on file  . Transportation needs - medical: Not on file  . Transportation needs - non-medical: Not on file  Occupational History  . Not on file  Tobacco Use  . Smoking status: Never Smoker  . Smokeless tobacco: Never Used  Substance and Sexual Activity  . Alcohol use: Yes    Alcohol/week: 0.0 oz  . Drug use: No  . Sexual activity: Not on file  Other Topics Concern  . Not on file  Social History Narrative  . Not on file    Past Medical History:  Diagnosis Date  . Bone spur   . Hernia 2014  . Hyperlipidemia      Patient Active Problem List   Diagnosis Date Noted  . Acid reflux 04/08/2015  . Enlarged prostate 04/08/2015  . Hernia, inguinal, right 04/08/2015  . Apnea, sleep 04/08/2015  . Encounter for  screening colonoscopy 08/31/2012  . Inguinal hernia 08/31/2012  . Hypercholesterolemia without hypertriglyceridemia 09/15/2005  . Essential (primary) hypertension 03/26/1995    Past Surgical History:  Procedure Laterality Date  . BACK SURGERY  2008  . KNEE ARTHROSCOPY Left   . KNEE SURGERY Left [id number]  . TONSILLECTOMY AND ADENOIDECTOMY      His family history includes Alcohol abuse in his paternal grandfather; CVA in his paternal grandmother; Cancer in his mother; Dementia in his maternal grandmother; Healthy in his daughter; Heart attack in his father and paternal grandmother; Peptic Ulcer Disease in his mother.      Current Outpatient Medications:  .  aspirin 81 MG tablet, Take 81 mg by mouth daily., Disp: , Rfl:  .  DiphenhydrAMINE HCl, Sleep, (NIGHTTIME SLEEP AID) 50 MG CAPS, Take 1 capsule by mouth at bedtime as needed., Disp: , Rfl:  .  lisinopril-hydrochlorothiazide (PRINZIDE,ZESTORETIC) 10-12.5 MG tablet, TAKE 1 TABLET BY MOUTH DAILY (Patient taking differently: TAKE 1 TABLET BY MOUTH every other day), Disp: 90 tablet, Rfl: 3 .  lovastatin (MEVACOR) 20 MG tablet, TAKE 1 TABLET BY MOUTH AT BEDTIME, Disp: 90 tablet, Rfl: 1 .  metoprolol tartrate (LOPRESSOR) 25 MG tablet, Take 50 mg by mouth. Take 25 mg daily, Disp: , Rfl:  .  ranitidine (ZANTAC) 150 MG capsule, Take 150 mg by mouth daily. , Disp: , Rfl:  Patient Care Team: Junette Bernat, Vickki Muff, PA as PCP - General (Family Medicine) Bary Castilla Forest Gleason, MD (General Surgery)      Objective:   Vitals: BP 112/78 (BP Location: Right Arm, Patient Position: Sitting, Cuff Size: Normal)   Pulse (!) 50   Temp 98.2 F (36.8 C) (Oral)   Ht 5\' 9"  (1.753 m)   Wt 188 lb 9.6 oz (85.5 kg)   SpO2 99%   BMI 27.85 kg/m   Physical Exam  Constitutional: He is oriented to person, place, and time. He appears well-developed and well-nourished.  HENT:  Head: Normocephalic and atraumatic.  Right Ear: External ear normal.  Left Ear:  External ear normal.  Nose: Nose normal.  Mouth/Throat: Oropharynx is clear and moist.  Eyes: Conjunctivae and EOM are normal. Pupils are equal, round, and reactive to light. Right eye exhibits no discharge.  Neck: Normal range of motion. Neck supple. No tracheal deviation present. No thyromegaly present.  Cardiovascular: Normal rate, regular rhythm, normal heart sounds and intact distal pulses.  No murmur heard. Pulmonary/Chest: Effort normal and breath sounds normal. No respiratory distress. He has no wheezes. He has no rales. He exhibits no tenderness.  Abdominal: Soft. He exhibits no distension and no mass. There is no tenderness. There is no rebound and no guarding.  Musculoskeletal: Normal range of motion. He exhibits no edema or tenderness.  Flat arches with 1st MTP bunion on the right foot.  Lymphadenopathy:    He has no cervical adenopathy.  Neurological: He is alert and oriented to person, place, and time. He has normal reflexes. No cranial nerve deficit. He exhibits normal muscle tone. Coordination normal.  Skin: Skin is warm and dry. No rash noted. No erythema.  Psychiatric: He has a normal mood and affect. His behavior is normal. Judgment and thought content normal.    Hearing Screening   125Hz  250Hz  500Hz  1000Hz  2000Hz  3000Hz  4000Hz  6000Hz  8000Hz   Right ear:   25 40 25  40    Left ear:   40 40 40  40      Activities of Daily Living In your present state of health, do you have any difficulty performing the following activities: 09/25/2016  Hearing? N  Vision? N  Difficulty concentrating or making decisions? N  Walking or climbing stairs? N  Dressing or bathing? N  Doing errands, shopping? N  Some recent data might be hidden    Fall Risk Assessment Fall Risk  09/25/2016  Falls in the past year? No     Depression Screen PHQ 2/9 Scores 09/25/2016  PHQ - 2 Score 0  PHQ- 9 Score 1    Cognitive Testing - 6-CIT  Correct? Score   What year is it? yes 0 0 or 4  What  month is it? yes 0 0 or 3  Memorize:    Warren Jackson,  43,  [place],      What time is it? (within 1 hour) yes 0 0 or 3  Count backwards from 20 yes 0 0, 2, or 4  Name the months of the year yes 0 0, 2, or 4  Repeat name & address above yes 0 0, 2, 4, 6, 8, or 10       TOTAL SCORE  0/28   Interpretation:  Normal  Normal (0-7) Abnormal (8-28)    Assessment & Plan:     Initial Preventative Physical Exam  Reviewed patient's Family Medical History Reviewed and updated list of patient's medical  providers Assessment of cognitive impairment was done Assessed patient's functional ability Established a written schedule for health screening [place] Completed and Reviewed  Exercise Activities and Dietary recommendations Goals    Continue walking 4-5.5 miles 4-6 days a week for exercise and watch fats in diet.      Immunization History  Administered Date(s) Administered  . Influenza Split 12/18/2011  . Influenza-Unspecified 11/26/2016  . Pneumococcal Conjugate-13 02/25/2017  . Tdap 02/25/2017  . Zoster 05/17/2012    Health Maintenance  Topic Date Due  . Hepatitis C Screening  04-08-1950  . HIV Screening  02/22/1967  . PNA vac Low Risk Adult (2 of 2 - PPSV23) 02/25/2018  . COLONOSCOPY  09/08/2022  . TETANUS/TDAP  02/26/2027  . INFLUENZA VACCINE  Completed     Discussed health benefits of physical activity, and encouraged him to engage in regular exercise appropriate for his age and condition.    ------------------------------------------------------------------------------------------------------------ 1. Welcome to Medicare preventive visit Good general health. No complaints or concerns. EKG WNL. Immunizations up to date. Will get routine labs and given anticipatory counseling.  - EKG 12-Lead - CBC with Differential/Platelet - Comprehensive metabolic panel - Lipid panel - PSA - TSH  2. Need for hepatitis C screening test - Hepatitis  C Antibody  3. Encounter for screening for HIV - HIV antibody  4. Essential (primary) hypertension BP well controlled by Lisinopril/HCTZ 10/12.5 mg qod and Metoprolol Tartrate 25 mg qd. No chest pains, edema or palpitations. Continue present dosages and recheck routine labs. - CBC with Differential/Platelet - Comprehensive metabolic panel - Lipid panel - TSH  5. Hypercholesterolemia without hypertriglyceridemia Tolerating Lovastatin 20 mg qd and continues low fat diet with daily exercise. Recheck labs and follow up pending reports. - Comprehensive metabolic panel - Lipid panel - TSH  6. Screening PSA (prostate specific antigen) - PSA    Vernie Murders, PA  [facility]

## 2017-04-02 LAB — LIPID PANEL
Chol/HDL Ratio: 3.5 ratio (ref 0.0–5.0)
Cholesterol, Total: 170 mg/dL (ref 100–199)
HDL: 48 mg/dL (ref >39)
LDL Calculated: 105 mg/dL — ABNORMAL HIGH (ref 0–99)
Triglycerides: 85 mg/dL (ref 0–149)
VLDL CHOLESTEROL CAL: 17 mg/dL (ref 5–40)

## 2017-04-02 LAB — CBC WITH DIFFERENTIAL/PLATELET
BASOS ABS: 0 10*3/uL (ref 0.0–0.2)
Basos: 0 %
EOS (ABSOLUTE): 0.3 10*3/uL (ref 0.0–0.4)
Eos: 4 %
Hematocrit: 47.2 % (ref 37.5–51.0)
Hemoglobin: 16.4 g/dL (ref 13.0–17.7)
Immature Grans (Abs): 0 10*3/uL (ref 0.0–0.1)
Immature Granulocytes: 0 %
LYMPHS ABS: 1.7 10*3/uL (ref 0.7–3.1)
LYMPHS: 27 %
MCH: 29.9 pg (ref 26.6–33.0)
MCHC: 34.7 g/dL (ref 31.5–35.7)
MCV: 86 fL (ref 79–97)
MONOS ABS: 0.5 10*3/uL (ref 0.1–0.9)
Monocytes: 8 %
NEUTROS ABS: 3.9 10*3/uL (ref 1.4–7.0)
Neutrophils: 61 %
PLATELETS: 186 10*3/uL (ref 150–379)
RBC: 5.49 x10E6/uL (ref 4.14–5.80)
RDW: 13.7 % (ref 12.3–15.4)
WBC: 6.4 10*3/uL (ref 3.4–10.8)

## 2017-04-02 LAB — COMPREHENSIVE METABOLIC PANEL
ALT: 18 IU/L (ref 0–44)
AST: 23 IU/L (ref 0–40)
Albumin/Globulin Ratio: 1.8 (ref 1.2–2.2)
Albumin: 4.6 g/dL (ref 3.6–4.8)
Alkaline Phosphatase: 64 IU/L (ref 39–117)
BILIRUBIN TOTAL: 0.6 mg/dL (ref 0.0–1.2)
BUN / CREAT RATIO: 27 — AB (ref 10–24)
BUN: 29 mg/dL — AB (ref 8–27)
CHLORIDE: 105 mmol/L (ref 96–106)
CO2: 21 mmol/L (ref 20–29)
Calcium: 9.7 mg/dL (ref 8.6–10.2)
Creatinine, Ser: 1.08 mg/dL (ref 0.76–1.27)
GFR calc Af Amer: 83 mL/min/{1.73_m2} (ref >59)
GFR calc non Af Amer: 72 mL/min/{1.73_m2} (ref >59)
GLUCOSE: 95 mg/dL (ref 65–99)
Globulin, Total: 2.6 g/dL (ref 1.5–4.5)
POTASSIUM: 4.4 mmol/L (ref 3.5–5.2)
Sodium: 143 mmol/L (ref 134–144)
Total Protein: 7.2 g/dL (ref 6.0–8.5)

## 2017-04-02 LAB — TSH: TSH: 3.63 u[IU]/mL (ref 0.450–4.500)

## 2017-04-02 LAB — HIV ANTIBODY (ROUTINE TESTING W REFLEX): HIV SCREEN 4TH GENERATION: NONREACTIVE

## 2017-04-02 LAB — PSA: Prostate Specific Ag, Serum: 5 ng/mL — ABNORMAL HIGH (ref 0.0–4.0)

## 2017-04-02 LAB — HEPATITIS C ANTIBODY

## 2017-04-26 ENCOUNTER — Other Ambulatory Visit: Payer: Self-pay | Admitting: Family Medicine

## 2017-04-26 MED ORDER — METOPROLOL TARTRATE 25 MG PO TABS: 25.0000 mg | ORAL_TABLET | Freq: Two times a day (BID) | ORAL | 3 refills | Status: DC

## 2017-04-26 MED ORDER — LOVASTATIN 20 MG PO TABS: 20.0000 mg | ORAL_TABLET | Freq: Every day | ORAL | 3 refills | Status: DC

## 2017-04-26 NOTE — Telephone Encounter (Signed)
[facility] faxed a refill request for a 90-days supply for the following medication. Thanks CC  lovastatin (MEVACOR) 20 MG tablet   metoprolol tartrate (LOPRESSOR) 25 MG tablet

## 2017-04-28 ENCOUNTER — Other Ambulatory Visit: Payer: Self-pay | Admitting: Family Medicine

## 2017-04-29 ENCOUNTER — Other Ambulatory Visit: Payer: Self-pay | Admitting: Family Medicine

## 2017-04-29 ENCOUNTER — Telehealth: Payer: Self-pay | Admitting: Family Medicine

## 2017-04-29 DIAGNOSIS — I10 Essential (primary) hypertension: Secondary | ICD-10-CM

## 2017-04-29 MED ORDER — METOPROLOL SUCCINATE ER 50 MG PO TB24: ORAL_TABLET | ORAL | 3 refills | Status: DC

## 2017-04-29 NOTE — Telephone Encounter (Signed)
May continue Metoprolol Succinate 50 mg 1/2 tablet qd and keep a check on BP. Call report of readings on BP in 2-3 weeks. Sent corrected prescription to the [facility].

## 2017-04-29 NOTE — Telephone Encounter (Signed)
Pt stated that he is at the pharmacy to pick up his medication and is confused because the pharmacy received and Rx metoprolol succinate (TOPROL-XL) 50 MG 24 hr tablet today 04/29/17 & received an Rx for metoprolol tartrate (LOPRESSOR) 25 MG tablet on 04/26/17. Pt is requesting a call back to advised him which medication he should be taking of the 2 medications what the dosage that he should be taking. Because the directions on the 50 mg was to take 1/2 tablet once a day and the 25 mg was to take 1 tablet twice a day. Pt is also requesting the correct Rx be [facility] and let them know which Rx they can disregard. Please advise. Thanks TNP

## 2017-04-29 NOTE — Telephone Encounter (Signed)
BP was higher in the office and wanted him to take the 25 mg BID. If he has any of the 50 mg Metoprolol Succinate at home, he can use it one tablet once a day until used up.

## 2017-04-29 NOTE — Telephone Encounter (Signed)
Patient advised.

## 2017-04-29 NOTE — Telephone Encounter (Signed)
Patient advised that he should be taking Metoprolol 25 mg BID. Patient prefers to continue Metoprolol 50 mg 1/2 tablet daily. He states he is exercising 4 days per week. Jogging 4-5.5 miles and monitors BP at home. He states some days BP is elevated and other days WNL. He would like to continue lifestyle modifications instead of increasing medication at this time. He states at times he feels his BP is to low, and he gets dizzy when standing up.

## 2017-04-29 NOTE — Telephone Encounter (Signed)
Pt stated he was returning CMA call and request call back at home. WO#[phone]. Please advise. Thanks TNP

## 2017-05-31 ENCOUNTER — Encounter: Payer: Self-pay | Admitting: Family Medicine

## 2017-08-02 ENCOUNTER — Other Ambulatory Visit: Payer: Self-pay | Admitting: Family Medicine

## 2017-10-14 ENCOUNTER — Ambulatory Visit: Payer: Medicare Other | Admitting: Family Medicine

## 2017-11-23 ENCOUNTER — Encounter: Payer: Self-pay | Admitting: Family Medicine

## 2017-11-23 ENCOUNTER — Ambulatory Visit: Payer: Medicare Other | Admitting: Family Medicine

## 2017-11-23 VITALS — BP 146/92 | HR 57 | Temp 98.2°F | Wt 183.0 lb

## 2017-11-23 DIAGNOSIS — I10 Essential (primary) hypertension: Secondary | ICD-10-CM | POA: Diagnosis not present

## 2017-11-23 DIAGNOSIS — E7800 Pure hypercholesterolemia, unspecified: Secondary | ICD-10-CM | POA: Diagnosis not present

## 2017-11-23 DIAGNOSIS — R972 Elevated prostate specific antigen [PSA]: Secondary | ICD-10-CM

## 2017-11-23 DIAGNOSIS — R42 Dizziness and giddiness: Secondary | ICD-10-CM

## 2017-11-23 NOTE — Progress Notes (Signed)
Patient: Warren Jackson Male    DOB: 1950/08/30   67 y.o.   MRN: [id number] Visit Date: 11/23/2017  Today's Provider: Vernie Murders, PA   Chief Complaint  Patient presents with  . Hyperlipidemia  . Hypertension   Subjective:    Hyperlipidemia  This is a chronic problem. The problem is controlled. Lipid results: LDL was slightly elevated at last check. Pertinent negatives include no chest pain, focal sensory loss, focal weakness, leg pain, myalgias or shortness of breath. He is currently on no antihyperlipidemic treatment. There are no compliance problems.   Hypertension  This is a chronic problem. Pertinent negatives include no anxiety, blurred vision, chest pain, headaches, malaise/fatigue, neck pain, orthopnea, palpitations, peripheral edema, PND, shortness of breath or sweats. There are no associated agents to hypertension. There are no compliance problems.    Lab Results  Component Value Date   CHOL 170 04/01/2017   CHOL 160 09/25/2016   CHOL 140 04/18/2015   Lab Results  Component Value Date   HDL 48 04/01/2017   HDL 47 09/25/2016   HDL 45 04/18/2015   Lab Results  Component Value Date   LDLCALC 105 (H) 04/01/2017   LDLCALC 94 09/25/2016   LDLCALC 81 04/18/2015   Lab Results  Component Value Date   TRIG 85 04/01/2017   TRIG 95 09/25/2016   TRIG 71 04/18/2015   Lab Results  Component Value Date   CHOLHDL 3.5 04/01/2017   CHOLHDL 3.4 09/25/2016   CHOLHDL 3.1 04/18/2015   No results found for: LDLDIRECT BP Readings from Last 3 Encounters:  11/23/17 (!) 146/92  04/01/17 112/78  02/25/17 (!) 142/98   Wt Readings from Last 3 Encounters:  11/23/17 183 lb (83 kg)  04/01/17 188 lb 9.6 oz (85.5 kg)  02/25/17 184 lb (83.5 kg)   Patient Active Problem List   Diagnosis Date Noted  . Acid reflux 04/08/2015  . Enlarged prostate 04/08/2015  . Hernia, inguinal, right 04/08/2015  . Apnea, sleep 04/08/2015  . Encounter for screening colonoscopy 08/31/2012   . Inguinal hernia 08/31/2012  . Hypercholesterolemia without hypertriglyceridemia 09/15/2005  . Essential (primary) hypertension 03/26/1995      Past Surgical History:  Procedure Laterality Date  . BACK SURGERY  2008  . KNEE ARTHROSCOPY Left   . KNEE SURGERY Left [id number]  . TONSILLECTOMY AND ADENOIDECTOMY     Family History  Problem Relation Age of Onset  . Heart attack Father   . Healthy Daughter   . Dementia Maternal Grandmother   . Heart attack Paternal Grandmother   . CVA Paternal Grandmother   . Alcohol abuse Paternal Grandfather   . Peptic Ulcer Disease Mother   . Cancer Mother    Allergies  Allergen Reactions  . Other     Telfa caused redness  . Penicillins Rash    Current Outpatient Medications:  .  aspirin 81 MG tablet, Take 81 mg by mouth daily., Disp: , Rfl:  .  DiphenhydrAMINE HCl, Sleep, (NIGHTTIME SLEEP AID) 50 MG CAPS, Take 1 capsule by mouth at bedtime as needed., Disp: , Rfl:  .  lisinopril-hydrochlorothiazide (PRINZIDE,ZESTORETIC) 10-12.5 MG tablet, TAKE 1 TABLET BY MOUTH DAILY, Disp: 90 tablet, Rfl: 3 .  lovastatin (MEVACOR) 20 MG tablet, Take 1 tablet (20 mg total) by mouth at bedtime., Disp: 90 tablet, Rfl: 3 .  metoprolol succinate (TOPROL-XL) 50 MG 24 hr tablet, TAKE 1/2 TABLET TABLET BY MOUTH DAILY, Disp: 45 tablet, Rfl: 3 .  ranitidine (  ZANTAC) 150 MG capsule, Take 150 mg by mouth daily. , Disp: , Rfl:   Review of Systems  Constitutional: Negative.  Negative for malaise/fatigue.  Eyes: Negative for blurred vision.  Respiratory: Negative.  Negative for shortness of breath.   Cardiovascular: Negative.  Negative for chest pain, palpitations, orthopnea and PND.  Gastrointestinal: Negative.   Musculoskeletal: Negative for myalgias and neck pain.  Neurological: Positive for light-headedness (When he stands up too quickly). Negative for dizziness, focal weakness and headaches.   Social History   Tobacco Use  . Smoking status: Never Smoker  .  Smokeless tobacco: Never Used  Substance Use Topics  . Alcohol use: Yes    Alcohol/week: 0.0 standard drinks   Objective:   BP (!) 146/92 (BP Location: Right Arm, Patient Position: Sitting, Cuff Size: Normal)   Pulse (!) 57   Temp 98.2 F (36.8 C) (Oral)   Wt 183 lb (83 kg)   SpO2 97%   BMI 27.02 kg/m  Vitals:   11/23/17 0810  BP: (!) 146/92  Pulse: (!) 57  Temp: 98.2 F (36.8 C)  TempSrc: Oral  SpO2: 97%  Weight: 183 lb (83 kg)   Wt Readings from Last 3 Encounters:  11/23/17 183 lb (83 kg)  04/01/17 188 lb 9.6 oz (85.5 kg)  02/25/17 184 lb (83.5 kg)   Physical Exam  Constitutional: He is oriented to person, place, and time. He appears well-developed and well-nourished. No distress.  HENT:  Head: Normocephalic and atraumatic.  Right Ear: Hearing normal.  Left Ear: Hearing normal.  Nose: Nose normal.  Eyes: Conjunctivae and lids are normal. Right eye exhibits no discharge. Left eye exhibits no discharge. No scleral icterus.  Cardiovascular: Normal rate, regular rhythm and normal heart sounds.  Pulmonary/Chest: Effort normal and breath sounds normal. No respiratory distress.  Musculoskeletal: Normal range of motion. He exhibits no edema.  Neurological: He is alert and oriented to person, place, and time.  Skin: Skin is intact. No lesion and no rash noted.  Psychiatric: He has a normal mood and affect. His speech is normal and behavior is normal. Thought content normal.      Assessment & Plan:     1. Essential (primary) hypertension Elevated BP this morning (did not take the Lisinopril-HCTZ 10-12.5 mg qd today - only the Metoprolol Succinate 50 mg 1/2 tablet qd. Continues to restrict sodium and exercise regularly. Recheck labs and get back on the Prinzide daily. - CBC with Differential/Platelet - Comprehensive metabolic panel - TSH  2. Hypercholesterolemia without hypertriglyceridemia Tolerating Lovastatin 20 mg qd. Continue daily exercise program (walking 3 miles)  and low fat diet. Recheck labs and follow up pending reports. - Comprehensive metabolic panel - Lipid panel - TSH  3. Feeling light headed Occurs intermittently when he stands up quickly form a seated position with legs elevated. No chest pain, dyspnea, palpitations or edema. Heart rate usually a little slow in the 50-60 range. May need to hold the Metoprolol Succinate 50 mg 1/2 tablet qd. Check labs for metabolic disorder or anemia. Check orthostatic BP at home - in the office sitting with legs elevated 150/90, immediately standing 156/92 without lightheaded sensation. Don't skip meals and drink extra fluids. Follow up pending lab reports. - CBC with Differential/Platelet - Comprehensive metabolic panel - TSH  4. Elevated PSA Six months ago PSA was 5.0. Is still asymptomatic. Will recheck PSA for any progression. - PSA       Vernie Murders, PA  [facility]

## 2017-11-24 LAB — CBC WITH DIFFERENTIAL/PLATELET
BASOS ABS: 0 10*3/uL (ref 0.0–0.2)
Basos: 0 %
EOS (ABSOLUTE): 0.2 10*3/uL (ref 0.0–0.4)
Eos: 4 %
Hematocrit: 46.8 % (ref 37.5–51.0)
Hemoglobin: 16.1 g/dL (ref 13.0–17.7)
Immature Grans (Abs): 0 10*3/uL (ref 0.0–0.1)
Immature Granulocytes: 0 %
LYMPHS ABS: 1.2 10*3/uL (ref 0.7–3.1)
Lymphs: 22 %
MCH: 29.8 pg (ref 26.6–33.0)
MCHC: 34.4 g/dL (ref 31.5–35.7)
MCV: 87 fL (ref 79–97)
MONOS ABS: 0.4 10*3/uL (ref 0.1–0.9)
Monocytes: 7 %
Neutrophils Absolute: 3.6 10*3/uL (ref 1.4–7.0)
Neutrophils: 67 %
Platelets: 208 10*3/uL (ref 150–450)
RBC: 5.41 x10E6/uL (ref 4.14–5.80)
RDW: 13.4 % (ref 12.3–15.4)
WBC: 5.4 10*3/uL (ref 3.4–10.8)

## 2017-11-24 LAB — COMPREHENSIVE METABOLIC PANEL
ALBUMIN: 4.7 g/dL (ref 3.6–4.8)
ALK PHOS: 65 IU/L (ref 39–117)
ALT: 16 IU/L (ref 0–44)
AST: 22 IU/L (ref 0–40)
Albumin/Globulin Ratio: 2 (ref 1.2–2.2)
BUN / CREAT RATIO: 18 (ref 10–24)
BUN: 21 mg/dL (ref 8–27)
Bilirubin Total: 0.7 mg/dL (ref 0.0–1.2)
CO2: 24 mmol/L (ref 20–29)
Calcium: 9.7 mg/dL (ref 8.6–10.2)
Chloride: 99 mmol/L (ref 96–106)
Creatinine, Ser: 1.14 mg/dL (ref 0.76–1.27)
GFR, EST AFRICAN AMERICAN: 78 mL/min/{1.73_m2} (ref >59)
GFR, EST NON AFRICAN AMERICAN: 67 mL/min/{1.73_m2} (ref >59)
GLOBULIN, TOTAL: 2.3 g/dL (ref 1.5–4.5)
Glucose: 100 mg/dL — ABNORMAL HIGH (ref 65–99)
Potassium: 4.4 mmol/L (ref 3.5–5.2)
SODIUM: 140 mmol/L (ref 134–144)
TOTAL PROTEIN: 7 g/dL (ref 6.0–8.5)

## 2017-11-24 LAB — LIPID PANEL
CHOL/HDL RATIO: 3.8 ratio (ref 0.0–5.0)
CHOLESTEROL TOTAL: 179 mg/dL (ref 100–199)
HDL: 47 mg/dL (ref >39)
LDL CALC: 110 mg/dL — AB (ref 0–99)
Triglycerides: 110 mg/dL (ref 0–149)
VLDL CHOLESTEROL CAL: 22 mg/dL (ref 5–40)

## 2017-11-24 LAB — TSH: TSH: 5.07 u[IU]/mL — ABNORMAL HIGH (ref 0.450–4.500)

## 2017-11-24 LAB — PSA: PROSTATE SPECIFIC AG, SERUM: 4.6 ng/mL — AB (ref 0.0–4.0)

## 2017-11-25 ENCOUNTER — Telehealth: Payer: Self-pay

## 2017-11-25 ENCOUNTER — Encounter: Payer: Self-pay | Admitting: Family Medicine

## 2017-11-25 MED ORDER — LOVASTATIN 40 MG PO TABS: 40.0000 mg | ORAL_TABLET | Freq: Every day | ORAL | 2 refills | Status: DC

## 2017-11-25 NOTE — Telephone Encounter (Signed)
Opened in error

## 2017-11-25 NOTE — Telephone Encounter (Addendum)
Pt advised as directed below.  [facility]   Thanks,   Mickel Baas     ----- Message from Margo Common, PA sent at 11/25/2017  8:04 AM EDT ----- All blood tests essentially normal. LDL cholesterol still above 100. Increase Lovastatin to 40 mg qd and recheck in 3 months. PSA coming back down. Recheck that level in a year.

## 2018-03-03 ENCOUNTER — Other Ambulatory Visit: Payer: Self-pay | Admitting: Family Medicine
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
## 2018-04-21 ENCOUNTER — Ambulatory Visit: Payer: Medicare Other | Admitting: Family Medicine

## 2018-04-21 ENCOUNTER — Encounter: Payer: Self-pay | Admitting: Family Medicine

## 2018-04-21 VITALS — BP 102/64 | HR 67 | Temp 98.1°F | Resp 16 | Wt 188.0 lb

## 2018-04-21 DIAGNOSIS — I10 Essential (primary) hypertension: Secondary | ICD-10-CM

## 2018-04-21 DIAGNOSIS — R55 Syncope and collapse: Secondary | ICD-10-CM

## 2018-04-21 DIAGNOSIS — E7800 Pure hypercholesterolemia, unspecified: Secondary | ICD-10-CM

## 2018-04-22 ENCOUNTER — Encounter: Payer: Self-pay | Admitting: Family Medicine

## 2018-04-22 LAB — CBC WITH DIFFERENTIAL/PLATELET
BASOS ABS: 0.1 10*3/uL (ref 0.0–0.2)
Basos: 1 %
EOS (ABSOLUTE): 0.3 10*3/uL (ref 0.0–0.4)
EOS: 4 %
HEMATOCRIT: 47.1 % (ref 37.5–51.0)
Hemoglobin: 16.4 g/dL (ref 13.0–17.7)
IMMATURE GRANULOCYTES: 0 %
Immature Grans (Abs): 0 10*3/uL (ref 0.0–0.1)
LYMPHS ABS: 1.5 10*3/uL (ref 0.7–3.1)
Lymphs: 23 %
MCH: 29.4 pg (ref 26.6–33.0)
MCHC: 34.8 g/dL (ref 31.5–35.7)
MCV: 84 fL (ref 79–97)
MONOS ABS: 0.5 10*3/uL (ref 0.1–0.9)
Monocytes: 8 %
NEUTROS PCT: 64 %
Neutrophils Absolute: 4.1 10*3/uL (ref 1.4–7.0)
PLATELETS: 213 10*3/uL (ref 150–450)
RBC: 5.58 x10E6/uL (ref 4.14–5.80)
RDW: 13.4 % (ref 11.6–15.4)
WBC: 6.3 10*3/uL (ref 3.4–10.8)

## 2018-04-22 LAB — COMPREHENSIVE METABOLIC PANEL
ALT: 16 IU/L (ref 0–44)
AST: 19 IU/L (ref 0–40)
Albumin/Globulin Ratio: 2.1 (ref 1.2–2.2)
Albumin: 4.6 g/dL (ref 3.8–4.8)
Alkaline Phosphatase: 61 IU/L (ref 39–117)
BUN/Creatinine Ratio: 23 (ref 10–24)
BUN: 24 mg/dL (ref 8–27)
Bilirubin Total: 0.6 mg/dL (ref 0.0–1.2)
CALCIUM: 9.6 mg/dL (ref 8.6–10.2)
CO2: 24 mmol/L (ref 20–29)
Chloride: 102 mmol/L (ref 96–106)
Creatinine, Ser: 1.03 mg/dL (ref 0.76–1.27)
GFR calc Af Amer: 87 mL/min/{1.73_m2} (ref >59)
GFR calc non Af Amer: 75 mL/min/{1.73_m2} (ref >59)
GLOBULIN, TOTAL: 2.2 g/dL (ref 1.5–4.5)
GLUCOSE: 97 mg/dL (ref 65–99)
POTASSIUM: 4 mmol/L (ref 3.5–5.2)
Sodium: 141 mmol/L (ref 134–144)
Total Protein: 6.8 g/dL (ref 6.0–8.5)

## 2018-04-22 LAB — LIPID PANEL
CHOLESTEROL TOTAL: 166 mg/dL (ref 100–199)
Chol/HDL Ratio: 3.7 ratio (ref 0.0–5.0)
HDL: 45 mg/dL (ref >39)
LDL Calculated: 105 mg/dL — ABNORMAL HIGH (ref 0–99)
Triglycerides: 82 mg/dL (ref 0–149)
VLDL CHOLESTEROL CAL: 16 mg/dL (ref 5–40)

## 2018-04-22 LAB — TSH: TSH: 3.83 u[IU]/mL (ref 0.450–4.500)

## 2018-04-22 LAB — HEMOGLOBIN A1C
Est. average glucose Bld gHb Est-mCnc: 117 mg/dL
HEMOGLOBIN A1C: 5.7 % — AB (ref 4.8–5.6)

## 2018-05-09 ENCOUNTER — Other Ambulatory Visit: Payer: Self-pay | Admitting: Family Medicine

## 2018-05-09 DIAGNOSIS — I10 Essential (primary) hypertension: Secondary | ICD-10-CM

## 2018-06-21 ENCOUNTER — Encounter: Payer: Self-pay | Admitting: Family Medicine

## 2018-11-04 ENCOUNTER — Encounter: Payer: Self-pay | Admitting: Family Medicine

## 2019-01-31 ENCOUNTER — Encounter: Payer: Medicare Other | Admitting: Family Medicine

## 2019-02-02 ENCOUNTER — Ambulatory Visit (INDEPENDENT_AMBULATORY_CARE_PROVIDER_SITE_OTHER): Payer: Medicare Other | Admitting: Family Medicine

## 2019-02-02 ENCOUNTER — Other Ambulatory Visit: Payer: Self-pay

## 2019-02-02 VITALS — BP 146/80 | HR 64 | Temp 96.9°F | Ht 69.0 in | Wt 184.0 lb

## 2019-02-02 DIAGNOSIS — I10 Essential (primary) hypertension: Secondary | ICD-10-CM

## 2019-02-02 DIAGNOSIS — Z0000 Encounter for general adult medical examination without abnormal findings: Secondary | ICD-10-CM | POA: Diagnosis not present

## 2019-02-02 DIAGNOSIS — E7800 Pure hypercholesterolemia, unspecified: Secondary | ICD-10-CM

## 2019-02-02 DIAGNOSIS — R7309 Other abnormal glucose: Secondary | ICD-10-CM

## 2019-02-02 DIAGNOSIS — R972 Elevated prostate specific antigen [PSA]: Secondary | ICD-10-CM

## 2019-02-02 MED ORDER — METOPROLOL SUCCINATE ER 25 MG PO TB24: ORAL_TABLET | ORAL | 3 refills | Status: DC

## 2019-02-02 NOTE — Progress Notes (Signed)
Patient: Warren Jackson, Male    DOB: [birth year], 68 y.o.   MRN: [id number] Visit Date: 02/02/2019  Today's Provider: Vernie Murders, PA   Chief Complaint  Patient presents with  . Annual Exam   Subjective:  Warren Jackson is a 68 y.o. male who presents today for health maintenance and complete physical. He feels well. He reports exercising daily. He reports he is sleeping well.  Immunization History  Administered Date(s) Administered  . Influenza Split 12/18/2011  . Influenza, High Dose Seasonal PF 11/23/2017  . Influenza-Unspecified 11/26/2016, 11/03/2018  . Pneumococcal Conjugate-13 02/25/2017, 11/03/2018  . Tdap 02/25/2017  . Zoster 05/17/2012  . Zoster Recombinat (Shingrix) 10/30/2017   09/07/12 Colonoscopy  Review of Systems  Constitutional: Negative.   HENT: Negative.   Eyes: Negative.   Respiratory: Negative.   Cardiovascular: Negative.   Gastrointestinal: Negative.   Endocrine: Negative.   Genitourinary: Negative.   Musculoskeletal: Positive for arthralgias and back pain.  Skin: Negative.   Allergic/Immunologic: Positive for environmental allergies.  Neurological: Negative.   Hematological: Negative.   Psychiatric/Behavioral: Negative.     Social History   Socioeconomic History  . Marital status: Married    Spouse name: Not on file  . Number of children: Not on file  . Years of education: Not on file  . Highest education level: Not on file  Occupational History  . Not on file  Social Needs  . Financial resource strain: Not on file  . Food insecurity    Worry: Not on file    Inability: Not on file  . Transportation needs    Medical: Not on file    Non-medical: Not on file  Tobacco Use  . Smoking status: Never Smoker  . Smokeless tobacco: Never Used  Substance and Sexual Activity  . Alcohol use: Yes    Alcohol/week: 0.0 standard drinks  . Drug use: No  . Sexual activity: Not on file  Lifestyle  . Physical activity    Days per week: Not on  file    Minutes per session: Not on file  . Stress: Not on file  Relationships  . Social Herbalist on phone: Not on file    Gets together: Not on file    Attends religious service: Not on file    Active member of club or organization: Not on file    Attends meetings of clubs or organizations: Not on file    Relationship status: Not on file  . Intimate partner violence    Fear of current or ex partner: Not on file    Emotionally abused: Not on file    Physically abused: Not on file    Forced sexual activity: Not on file  Other Topics Concern  . Not on file  Social History Narrative  . Not on file    Patient Active Problem List   Diagnosis Date Noted  . Acid reflux 04/08/2015  . Enlarged prostate 04/08/2015  . Hernia, inguinal, right 04/08/2015  . Apnea, sleep 04/08/2015  . Encounter for screening colonoscopy 08/31/2012  . Inguinal hernia 08/31/2012  . Hypercholesterolemia without hypertriglyceridemia 09/15/2005  . Essential (primary) hypertension 03/26/1995    Past Surgical History:  Procedure Laterality Date  . BACK SURGERY  2008  . KNEE ARTHROSCOPY Left   . KNEE SURGERY Left [id number]  . TONSILLECTOMY AND ADENOIDECTOMY      His family history includes Alcohol abuse in his paternal grandfather; CVA in his paternal grandmother; Cancer in his mother;  Dementia in his maternal grandmother; Healthy in his daughter; Heart attack in his father and paternal grandmother; Peptic Ulcer Disease in his mother.     Allergies  Allergen Reactions  . Other     Telfa caused redness  . Penicillins Rash   Outpatient Encounter Medications as of 02/02/2019  Medication Sig  . aspirin 81 MG tablet Take 81 mg by mouth daily.  . DiphenhydrAMINE HCl, Sleep, (NIGHTTIME SLEEP AID) 50 MG CAPS Take 1 capsule by mouth at bedtime as needed.  Marland Kitchen lisinopril-hydrochlorothiazide (PRINZIDE,ZESTORETIC) 10-12.5 MG tablet TAKE 1 TABLET BY MOUTH DAILY (Patient taking differently: Takes every  other day)  . lovastatin (MEVACOR) 40 MG tablet TAKE 1 TABLET BY MOUTH EVERYDAY AT BEDTIME  . metoprolol succinate (TOPROL-XL) 50 MG 24 hr tablet TAKE 1/2 TABLET BY MOUTH DAILY  . [DISCONTINUED] ranitidine (ZANTAC) 150 MG capsule Take 150 mg by mouth daily.    No facility-administered encounter medications on file as of 02/02/2019.     Patient Care Team: Chrismon, Vickki Muff, PA as PCP - General (Family Medicine) Robert Bellow, MD (General Surgery)      Objective:   Vitals:  Vitals:   02/02/19 1334  BP: (!) 146/80  Pulse: 64  Temp: (!) 96.9 F (36.1 C)  TempSrc: Skin  SpO2: 99%  Weight: 184 lb (83.5 kg)  Height: 5\' 9"  (1.753 m)   Wt Readings from Last 3 Encounters:  02/02/19 184 lb (83.5 kg)  04/21/18 188 lb (85.3 kg)  11/23/17 183 lb (83 kg)   BP Readings from Last 3 Encounters:  02/02/19 (!) 146/80  04/21/18 102/64  11/23/17 (!) 146/92   Physical Exam Constitutional:      General: He is not in acute distress.    Appearance: He is well-developed.  HENT:     Head: Normocephalic and atraumatic.     Right Ear: Hearing and tympanic membrane normal.     Left Ear: Hearing and tympanic membrane normal.     Nose: Nose normal.     Mouth/Throat:     Mouth: Mucous membranes are moist.  Eyes:     General: Lids are normal. No scleral icterus.       Right eye: No discharge.        Left eye: No discharge.     Conjunctiva/sclera: Conjunctivae normal.  Neck:     Musculoskeletal: Normal range of motion and neck supple.  Cardiovascular:     Rate and Rhythm: Normal rate and regular rhythm.     Pulses: Normal pulses.     Heart sounds: Normal heart sounds.  Pulmonary:     Effort: Pulmonary effort is normal. No respiratory distress.     Breath sounds: Normal breath sounds.  Abdominal:     General: Bowel sounds are normal.     Palpations: Abdomen is soft.  Musculoskeletal: Normal range of motion.  Skin:    Findings: No lesion or rash.  Neurological:     Mental Status:  He is alert and oriented to person, place, and time.  Psychiatric:        Speech: Speech normal.        Behavior: Behavior normal.        Thought Content: Thought content normal.    Depression Screen PHQ 2/9 Scores 09/25/2016  PHQ - 2 Score 0  PHQ- 9 Score 1   Assessment & Plan:     Routine Health Maintenance and Physical Exam  Exercise Activities and Dietary recommendations Goals   Walking 4  miles a day for the past 277 days.     Immunization History  Administered Date(s) Administered  . Influenza Split 12/18/2011  . Influenza, High Dose Seasonal PF 11/23/2017  . Influenza-Unspecified 11/26/2016, 11/03/2018  . Pneumococcal Conjugate-13 02/25/2017, 11/03/2018  . Tdap 02/25/2017  . Zoster 05/17/2012  . Zoster Recombinat (Shingrix) 10/30/2017    Health Maintenance  Topic Date Due  . PNA vac Low Risk Adult (2 of 2 - PPSV23) 11/03/2019  . COLONOSCOPY  09/08/2022  . TETANUS/TDAP  02/26/2027  . INFLUENZA VACCINE  Completed  . Hepatitis C Screening  Completed     Discussed health benefits of physical activity, and encouraged him to engage in regular exercise appropriate for his age and condition.   1. Medicare annual wellness visit, subsequent General health very good. Immunizations up to date. Given anticipatory counseling. Colonoscopy up to date.  2. Essential (primary) hypertension Fair control with diet, regular exercise, Metoprolol succinate 25 mg qd and Prinzide 10-12.5 mg qod. - CBC with Differential/Platelet - Comprehensive metabolic panel - Lipid Panel With LDL/HDL Ratio - TSH  3. Hypercholesterolemia without hypertriglyceridemia Tolerating Lovastatin 40 mg qd without side effects. Trying to follow a low fat diet and exercising daily. Recheck CMP, Lipid Panel and TSH. Lipid Panel     Component Value Date/Time   CHOL 166 04/21/2018 0911   TRIG 82 04/21/2018 0911   HDL 45 04/21/2018 0911   CHOLHDL 3.7 04/21/2018 0911   LDLCALC 105 (H) 04/21/2018 0911    LABVLDL 16 04/21/2018 0911   - Comprehensive metabolic panel - Lipid Panel With LDL/HDL Ratio - TSH  4. Elevated PSA History of PSA 5.0 on 04-01-17. No significant dysuria, hematuria, urinary frequency and only nocturia once or twice a night. Will recheck PSA. - PSA  5. Elevated glucose History of slight glucose elevation in the past with Hgb A1C 5.7% on 04-22-18. Admits to extra sweets and could restrict diet a bit more. Will recheck labs to assess progress. - Comprehensive metabolic panel - Hemoglobin A1c - TSH

## 2019-02-03 ENCOUNTER — Telehealth: Payer: Self-pay | Admitting: *Deleted

## 2019-02-03 ENCOUNTER — Other Ambulatory Visit: Payer: Self-pay | Admitting: Family Medicine

## 2019-02-03 ENCOUNTER — Encounter: Payer: Self-pay | Admitting: Family Medicine

## 2019-02-03 DIAGNOSIS — I10 Essential (primary) hypertension: Secondary | ICD-10-CM

## 2019-02-03 LAB — LIPID PANEL WITH LDL/HDL RATIO
Cholesterol, Total: 191 mg/dL (ref 100–199)
HDL: 47 mg/dL (ref >39)
LDL Chol Calc (NIH): 121 mg/dL — ABNORMAL HIGH (ref 0–99)
LDL/HDL Ratio: 2.6 ratio (ref 0.0–3.6)
Triglycerides: 131 mg/dL (ref 0–149)
VLDL Cholesterol Cal: 23 mg/dL (ref 5–40)

## 2019-02-03 LAB — COMPREHENSIVE METABOLIC PANEL
ALT: 17 IU/L (ref 0–44)
AST: 28 IU/L (ref 0–40)
Albumin/Globulin Ratio: 1.6 (ref 1.2–2.2)
Albumin: 4.6 g/dL (ref 3.8–4.8)
Alkaline Phosphatase: 66 IU/L (ref 39–117)
BUN/Creatinine Ratio: 26 — ABNORMAL HIGH (ref 10–24)
BUN: 28 mg/dL — ABNORMAL HIGH (ref 8–27)
Bilirubin Total: 0.8 mg/dL (ref 0.0–1.2)
CO2: 26 mmol/L (ref 20–29)
Calcium: 9.7 mg/dL (ref 8.6–10.2)
Chloride: 103 mmol/L (ref 96–106)
Creatinine, Ser: 1.06 mg/dL (ref 0.76–1.27)
GFR calc Af Amer: 84 mL/min/{1.73_m2} (ref >59)
GFR calc non Af Amer: 73 mL/min/{1.73_m2} (ref >59)
Globulin, Total: 2.8 g/dL (ref 1.5–4.5)
Glucose: 94 mg/dL (ref 65–99)
Potassium: 4.5 mmol/L (ref 3.5–5.2)
Sodium: 142 mmol/L (ref 134–144)
Total Protein: 7.4 g/dL (ref 6.0–8.5)

## 2019-02-03 LAB — CBC WITH DIFFERENTIAL/PLATELET
Basophils Absolute: 0 10*3/uL (ref 0.0–0.2)
Basos: 0 %
EOS (ABSOLUTE): 0.1 10*3/uL (ref 0.0–0.4)
Eos: 2 %
Hematocrit: 48.1 % (ref 37.5–51.0)
Hemoglobin: 16.3 g/dL (ref 13.0–17.7)
Immature Grans (Abs): 0 10*3/uL (ref 0.0–0.1)
Immature Granulocytes: 0 %
Lymphocytes Absolute: 1.6 10*3/uL (ref 0.7–3.1)
Lymphs: 22 %
MCH: 29.3 pg (ref 26.6–33.0)
MCHC: 33.9 g/dL (ref 31.5–35.7)
MCV: 87 fL (ref 79–97)
Monocytes Absolute: 0.5 10*3/uL (ref 0.1–0.9)
Monocytes: 7 %
Neutrophils Absolute: 5 10*3/uL (ref 1.4–7.0)
Neutrophils: 69 %
Platelets: 225 10*3/uL (ref 150–450)
RBC: 5.56 x10E6/uL (ref 4.14–5.80)
RDW: 13.2 % (ref 11.6–15.4)
WBC: 7.3 10*3/uL (ref 3.4–10.8)

## 2019-02-03 LAB — PSA: Prostate Specific Ag, Serum: 5 ng/mL — ABNORMAL HIGH (ref 0.0–4.0)

## 2019-02-03 LAB — HEMOGLOBIN A1C
Est. average glucose Bld gHb Est-mCnc: 111 mg/dL
Hgb A1c MFr Bld: 5.5 % (ref 4.8–5.6)

## 2019-02-03 LAB — TSH: TSH: 4.83 u[IU]/mL — ABNORMAL HIGH (ref 0.450–4.500)

## 2019-02-03 MED ORDER — METOPROLOL SUCCINATE ER 25 MG PO TB24: 25.0000 mg | ORAL_TABLET | Freq: Every day | ORAL | 3 refills | Status: DC

## 2019-02-03 NOTE — Telephone Encounter (Signed)
Patient is requesting metoprolol 25 mg rx that was sent to pharmacy 02/02/2019, be resent with directions 1 tablet qd instead of 1/2. Please advise?

## 2019-02-03 NOTE — Telephone Encounter (Signed)
Changed the milligrams but did not change the directions to 1 tablet instead of !/2 tablet. Sent the correction to his pharmacy ([facility]).

## 2019-02-06 NOTE — Telephone Encounter (Signed)
LMTCB

## 2019-02-28 ENCOUNTER — Other Ambulatory Visit: Payer: Self-pay | Admitting: Family Medicine

## 2019-03-03 NOTE — Telephone Encounter (Signed)
Courtesy refill  

## 2019-05-02 ENCOUNTER — Other Ambulatory Visit: Payer: Self-pay | Admitting: Family Medicine

## 2019-05-02 DIAGNOSIS — I10 Essential (primary) hypertension: Secondary | ICD-10-CM

## 2019-05-02 NOTE — Telephone Encounter (Signed)
Requested medication (s) are due for refill today: No  Requested medication (s) are on the active medication list: Dose has changed to 25 mg  Last refill:  02/02/19  Future visit scheduled: No  Notes to clinic:  Dose changed on 02/02/19 to 25 mg    Requested Prescriptions  Pending Prescriptions Disp Refills   metoprolol succinate (TOPROL-XL) 50 MG 24 hr tablet [Pharmacy Med Name: METOPROLOL SUCC ER 50 MG TAB] 45 tablet 3    Sig: TAKE 1/2 TABLET BY MOUTH EVERY DAY      Cardiovascular:  Beta Blockers Failed - 05/02/2019  7:44 AM      Failed - Last BP in normal range    BP Readings from Last 1 Encounters:  02/02/19 (!) 146/80          Failed - Valid encounter within last 6 months    Recent Outpatient Visits           1 year ago Blackout spell   [facility], [place], [place]   1 year ago Essential (primary) hypertension   [facility], [place], [place]   2 years ago Acute pharyngitis, unspecified etiology   [facility], [place], [place]   2 years ago Chronic left shoulder pain   [facility], [place], [place]   4 years ago Pharyngitis   [place], [place]              Passed - Last Heart Rate in normal range    Pulse Readings from Last 1 Encounters:  02/02/19 64

## 2019-05-03 ENCOUNTER — Encounter: Payer: Self-pay | Admitting: Family Medicine

## 2019-05-04 ENCOUNTER — Other Ambulatory Visit: Payer: Self-pay | Admitting: Family Medicine

## 2019-05-04 DIAGNOSIS — I10 Essential (primary) hypertension: Secondary | ICD-10-CM

## 2019-05-04 MED ORDER — METOPROLOL SUCCINATE ER 25 MG PO TB24: 25.0000 mg | ORAL_TABLET | Freq: Every day | ORAL | 3 refills | Status: DC

## 2019-05-04 NOTE — Progress Notes (Signed)
Corrected prescription instructions to Metoprolol Succinate 25 mg qd.

## 2019-05-30 ENCOUNTER — Other Ambulatory Visit: Payer: Self-pay | Admitting: Family Medicine

## 2019-05-30 NOTE — Telephone Encounter (Signed)
Requested Prescriptions  Pending Prescriptions Disp Refills  . lovastatin (MEVACOR) 40 MG tablet [Pharmacy Med Name: LOVASTATIN 40 MG TABLET] 90 tablet 0    Sig: TAKE 1 TABLET BY MOUTH EVERYDAY AT BEDTIME     Cardiovascular:  Antilipid - Statins Failed - 05/30/2019 10:24 PM      Failed - LDL in normal range and within 360 days    LDL Chol Calc (NIH)  Date Value Ref Range Status  02/02/2019 121 (H) 0 - 99 mg/dL Final         Failed - Valid encounter within last 12 months    Recent Outpatient Visits          1 year ago Blackout spell   [facility], [place], [place]   1 year ago Essential (primary) hypertension   [facility], [place], [place]   2 years ago Acute pharyngitis, unspecified etiology   [facility], [place], [place]   2 years ago Chronic left shoulder pain   [facility], [place] Muff, [place]   4 years ago Pharyngitis   [facility], [place], [place]             Passed - Total Cholesterol in normal range and within 360 days    Cholesterol, Total  Date Value Ref Range Status  02/02/2019 191 100 - 199 mg/dL Final         Passed - HDL in normal range and within 360 days    HDL  Date Value Ref Range Status  02/02/2019 47 >39 mg/dL Final         Passed - Triglycerides in normal range and within 360 days    Triglycerides  Date Value Ref Range Status  02/02/2019 131 0 - 149 mg/dL Final         Passed - Patient is not pregnant

## 2019-06-13 ENCOUNTER — Other Ambulatory Visit: Payer: Self-pay | Admitting: Family Medicine

## 2019-06-13 DIAGNOSIS — E7800 Pure hypercholesterolemia, unspecified: Secondary | ICD-10-CM

## 2019-06-13 MED ORDER — LOVASTATIN 40 MG PO TABS: ORAL_TABLET | ORAL | 3 refills | Status: DC

## 2019-07-10 ENCOUNTER — Other Ambulatory Visit: Payer: Self-pay | Admitting: Family Medicine

## 2019-07-10 ENCOUNTER — Other Ambulatory Visit: Payer: Self-pay

## 2019-07-10 ENCOUNTER — Encounter: Payer: Self-pay | Admitting: Family Medicine

## 2019-07-10 MED ORDER — LISINOPRIL-HYDROCHLOROTHIAZIDE 10-12.5 MG PO TABS: 1.0000 | ORAL_TABLET | Freq: Every day | ORAL | 3 refills | Status: DC

## 2019-07-10 NOTE — Telephone Encounter (Signed)
Simona Huh, It looks like this has not been filled since 2019??

## 2019-09-13 ENCOUNTER — Telehealth: Payer: Self-pay

## 2019-09-13 NOTE — Telephone Encounter (Signed)
Copied from [id number] [id number] [id number]. Topic: General - Other >> Sep 13, 2019  1:18 PM Celene Kras wrote: Reason for CRM: Edison Nasuti, from [facility], called stating that the pt was told he needed to update his pcp due to his HMO plan. Edison Nasuti states that the pt does not have a HMO plan, but a PPO plan. Edison Nasuti is checking to make  sure the pt will not be turned away for future appts. Please advise. [phone]

## 2019-12-01 ENCOUNTER — Telehealth: Payer: Self-pay

## 2019-12-01 NOTE — Telephone Encounter (Signed)
Patient says that he is going to go to [facility] to have his covid testing done.

## 2019-12-01 NOTE — Telephone Encounter (Signed)
Copied from [id number]. Topic: General - Other >> Dec 01, 2019  1:43 PM Hinda Lenis D wrote: Reason for CRM: PT need a PCR covid test for traveling / please advise

## 2019-12-04 ENCOUNTER — Encounter: Payer: Self-pay | Admitting: Family Medicine

## 2020-02-27 ENCOUNTER — Encounter: Payer: Medicare Other | Admitting: Family Medicine

## 2020-03-05 ENCOUNTER — Encounter: Payer: Self-pay | Admitting: Family Medicine

## 2020-03-05 ENCOUNTER — Other Ambulatory Visit: Payer: Self-pay

## 2020-03-05 ENCOUNTER — Ambulatory Visit (INDEPENDENT_AMBULATORY_CARE_PROVIDER_SITE_OTHER): Payer: Medicare PPO | Admitting: Family Medicine

## 2020-03-05 VITALS — BP 130/88 | HR 65 | Temp 98.4°F | Resp 16 | Ht 69.0 in | Wt 178.0 lb

## 2020-03-05 DIAGNOSIS — R972 Elevated prostate specific antigen [PSA]: Secondary | ICD-10-CM

## 2020-03-05 DIAGNOSIS — E7800 Pure hypercholesterolemia, unspecified: Secondary | ICD-10-CM

## 2020-03-05 DIAGNOSIS — I10 Essential (primary) hypertension: Secondary | ICD-10-CM

## 2020-03-05 NOTE — Progress Notes (Signed)
Annual Wellness Visit     Patient: Warren Jackson, Male    DOB: 14-May-1950, 69 y.o.   MRN: [id number] Visit Date: 03/05/2020  Today's Provider: Vernie Murders, PA-C   Chief Complaint  Patient presents with   Medicare [place] Mar is a 69 y.o. male who presents today for his Annual Wellness Visit. He reports consuming a general diet. Home exercise routine includes yoga and walking 4 miles daily. He generally feels fairly well. He reports sleeping poorly. He does not have additional problems to discuss today.    Past Medical History:  Diagnosis Date   Bone spur    Hernia 2014   Hyperlipidemia    Past Surgical History:  Procedure Laterality Date   BACK SURGERY  2008   KNEE ARTHROSCOPY Left    KNEE SURGERY Left [id number]   TONSILLECTOMY AND ADENOIDECTOMY     Social History   Tobacco Use   Smoking status: Never Smoker   Smokeless tobacco: Never Used  Substance Use Topics   Alcohol use: Yes    Alcohol/week: 0.0 standard drinks   Drug use: No   Family History  Problem Relation Age of Onset   Heart attack Father    Healthy Daughter    Dementia Maternal Grandmother    Heart attack Paternal Grandmother    CVA Paternal Grandmother    Alcohol abuse Paternal Grandfather    Peptic Ulcer Disease Mother    Cancer Mother    Allergies  Allergen Reactions   Other     Telfa caused redness   Penicillins Rash   Medications: Outpatient Medications Prior to Visit  Medication Sig   aspirin 81 MG tablet Take 81 mg by mouth daily.   diphenhydrAMINE HCl, Sleep, 50 MG CAPS Take 1 capsule by mouth at bedtime as needed.   lisinopril-hydrochlorothiazide (ZESTORETIC) 10-12.5 MG tablet Take 1 tablet by mouth daily.   lovastatin (MEVACOR) 40 MG tablet TAKE 1 TABLET BY MOUTH EVERYDAY AT BEDTIME   metoprolol succinate (TOPROL-XL) 25 MG 24 hr tablet Take 1 tablet (25 mg total) by mouth daily.   No facility-administered medications prior to visit.     Allergies  Allergen Reactions   Other     Telfa caused redness   Penicillins Rash    Patient Care Team: Yoona Ishii, Vickki Muff, PA-C as PCP - General (Family Medicine) Bary Castilla Forest Gleason, MD (General Surgery)  Review of Systems  Constitutional: Negative for appetite change, chills, fatigue and fever.  HENT: Negative for congestion, ear pain, hearing loss, nosebleeds and trouble swallowing.   Eyes: Negative for pain and visual disturbance.  Respiratory: Negative for cough, chest tightness and shortness of breath.   Cardiovascular: Negative for chest pain, palpitations and leg swelling.  Gastrointestinal: Negative for abdominal pain, blood in stool, constipation, diarrhea, nausea and vomiting.  Endocrine: Negative for polydipsia, polyphagia and polyuria.  Genitourinary: Negative for dysuria and flank pain.  Musculoskeletal: Negative for arthralgias, back pain, joint swelling, myalgias and neck stiffness.  Skin: Negative for color change, rash and wound.  Neurological: Positive for dizziness. Negative for tremors, seizures, speech difficulty, weakness, light-headedness and headaches.  Psychiatric/Behavioral: Negative for behavioral problems, confusion, decreased concentration, dysphoric mood and sleep disturbance. The patient is not nervous/anxious.   All other systems reviewed and are negative.     Objective    Vitals: BP 130/88 (BP Location: Right Arm, Patient Position: Sitting, Cuff Size: Normal)   Pulse 65   Temp 98.4  F (36.9 C) (Temporal)   Resp 16   Ht 5\' 9"  (1.753 m)   Wt 178 lb (80.7 kg)   BMI 26.29 kg/m  BP Readings from Last 3 Encounters:  03/05/20 130/88  02/02/19 (!) 146/80  04/21/18 102/64   Wt Readings from Last 3 Encounters:  03/05/20 178 lb (80.7 kg)  02/02/19 184 lb (83.5 kg)  04/21/18 188 lb (85.3 kg)     Physical Exam Constitutional:      Appearance: He is well-developed.  HENT:     Head: Normocephalic and atraumatic.     Right Ear: External ear  normal.     Left Ear: External ear normal.     Nose: Nose normal.  Eyes:     General:        Right eye: No discharge.     Conjunctiva/sclera: Conjunctivae normal.     Pupils: Pupils are equal, round, and reactive to light.  Neck:     Thyroid: No thyromegaly.     Trachea: No tracheal deviation.  Cardiovascular:     Rate and Rhythm: Normal rate and regular rhythm.     Heart sounds: Normal heart sounds. No murmur heard.   Pulmonary:     Effort: Pulmonary effort is normal. No respiratory distress.     Breath sounds: Normal breath sounds. No wheezing or rales.  Chest:     Chest wall: No tenderness.  Abdominal:     General: There is no distension.     Palpations: Abdomen is soft. There is no mass.     Tenderness: There is no abdominal tenderness. There is no guarding or rebound.  Genitourinary:    Penis: Normal.      Testes: Normal.     Prostate: Normal.     Rectum: Normal.  Musculoskeletal:        General: No tenderness. Normal range of motion.     Cervical back: Normal range of motion and neck supple.  Lymphadenopathy:     Cervical: No cervical adenopathy.  Skin:    General: Skin is warm and dry.     Findings: No erythema or rash.  Neurological:     Mental Status: He is alert and oriented to person, place, and time.     Cranial Nerves: No cranial nerve deficit.     Motor: No abnormal muscle tone.     Coordination: Coordination normal.     Deep Tendon Reflexes: Reflexes are normal and symmetric. Reflexes normal.  Psychiatric:        Behavior: Behavior normal.        Thought Content: Thought content normal.        Judgment: Judgment normal.    Most recent functional status assessment: In your present state of health, do you have any difficulty performing the following activities: 03/05/2020  Hearing? N  Vision? N  Difficulty concentrating or making decisions? N  Walking or climbing stairs? N  Dressing or bathing? N  Doing errands, shopping? N  Some recent data  might be hidden   Most recent fall risk assessment: Fall Risk  03/05/2020  Falls in the past year? 0  Number falls in past yr: 0  Injury with Fall? 0  Follow up Falls evaluation completed    Most recent depression screenings: PHQ 2/9 Scores 03/05/2020 02/02/2019  PHQ - 2 Score 0 0  PHQ- 9 Score 1 -   Most recent cognitive screening: No flowsheet data found. Most recent Audit-C alcohol use screening Alcohol Use Disorder Test (AUDIT)  03/05/2020  1. How often do you have a drink containing alcohol? 0  2. How many drinks containing alcohol do you have on a typical day when you are drinking? 0  3. How often do you have six or more drinks on one occasion? 0  AUDIT-C Score 0  Alcohol Brief Interventions/Follow-up AUDIT Score <7 follow-up not indicated   A score of 3 or more in women, and 4 or more in men indicates increased risk for alcohol abuse, EXCEPT if all of the points are from question 1   No results found for any visits on 03/05/20.  Assessment & Plan     Annual wellness visit done today including the all of the following: Reviewed patient's Family Medical History Reviewed and updated list of patient's medical providers Assessment of cognitive impairment was done Assessed patient's functional ability Established a written schedule for health screening [place] Completed and Reviewed  Exercise Activities and Dietary recommendations Goals   Continue present exercise program and low fat diet.     Immunization History  Administered Date(s) Administered   Influenza Split 12/18/2011   Influenza, High Dose Seasonal PF 11/23/2017, 12/01/2019   Influenza-Unspecified 11/26/2016, 11/03/2018   PFIZER SARS-COV-2 Vaccination 05/09/2019, 05/30/2019, 01/04/2020   Pneumococcal Conjugate-13 02/25/2017, 11/03/2018   Tdap 02/25/2017   Zoster 05/17/2012   Zoster Recombinat (Shingrix) 10/30/2017, 11/10/2018    Health Maintenance  Topic Date Due   PNA vac Low  Risk Adult (2 of 2 - PPSV23) 11/03/2019   COLONOSCOPY  09/08/2022   TETANUS/TDAP  02/26/2027   INFLUENZA VACCINE  Completed   COVID-19 Vaccine  Completed   Hepatitis C Screening  Completed     Discussed health benefits of physical activity, and encouraged him to engage in regular exercise appropriate for his age and condition.    1. Essential (primary) hypertension Well controlled BP on Zestoretic 10-12.5 mg qd and Metoprolol Succinate 25 mg qd. No chest pains, dyspnea, peripheral edema or palpitations. General health in good shape. Counseled regarding health maintenance. Recheck routine follow up labs. - CBC with Differential/Platelet - Comprehensive metabolic panel - Lipid panel - TSH  2. Hypercholesterolemia without hypertriglyceridemia Tolerating Lovastatin 40 mg qd without side effects. Continues to exercise a great deal daily. Recheck labs to assess progress. - Comprehensive metabolic panel - Lipid panel - TSH  3. Elevated PSA Last PSA was 5.0 on 02-02-19. Asymptomatic and normal DRE. - PSA   No follow-ups on file.     I, Aydia Maj, PA-C, have reviewed all documentation for this visit. The documentation on 12/06/20 for the exam, diagnosis, procedures, and orders are all accurate and complete.    Vernie Murders, PA-C  [employer] [phone] (phone) [phone] (fax)  [place]

## 2020-03-06 LAB — CBC WITH DIFFERENTIAL/PLATELET
Basophils Absolute: 0 10*3/uL (ref 0.0–0.2)
Basos: 0 %
EOS (ABSOLUTE): 0.2 10*3/uL (ref 0.0–0.4)
Eos: 2 %
Hematocrit: 50.8 % (ref 37.5–51.0)
Hemoglobin: 17.1 g/dL (ref 13.0–17.7)
Immature Grans (Abs): 0 10*3/uL (ref 0.0–0.1)
Immature Granulocytes: 0 %
Lymphocytes Absolute: 1.8 10*3/uL (ref 0.7–3.1)
Lymphs: 20 %
MCH: 29.2 pg (ref 26.6–33.0)
MCHC: 33.7 g/dL (ref 31.5–35.7)
MCV: 87 fL (ref 79–97)
Monocytes Absolute: 0.6 10*3/uL (ref 0.1–0.9)
Monocytes: 6 %
Neutrophils Absolute: 6.6 10*3/uL (ref 1.4–7.0)
Neutrophils: 72 %
Platelets: 246 10*3/uL (ref 150–450)
RBC: 5.85 x10E6/uL — ABNORMAL HIGH (ref 4.14–5.80)
RDW: 12.6 % (ref 11.6–15.4)
WBC: 9.2 10*3/uL (ref 3.4–10.8)

## 2020-03-06 LAB — COMPREHENSIVE METABOLIC PANEL
ALT: 19 IU/L (ref 0–44)
AST: 25 IU/L (ref 0–40)
Albumin/Globulin Ratio: 1.9 (ref 1.2–2.2)
Albumin: 4.8 g/dL (ref 3.8–4.8)
Alkaline Phosphatase: 67 IU/L (ref 44–121)
BUN/Creatinine Ratio: 22 (ref 10–24)
BUN: 28 mg/dL — ABNORMAL HIGH (ref 8–27)
Bilirubin Total: 0.9 mg/dL (ref 0.0–1.2)
CO2: 26 mmol/L (ref 20–29)
Calcium: 10.1 mg/dL (ref 8.6–10.2)
Chloride: 99 mmol/L (ref 96–106)
Creatinine, Ser: 1.27 mg/dL (ref 0.76–1.27)
GFR calc Af Amer: 67 mL/min/{1.73_m2} (ref >59)
GFR calc non Af Amer: 58 mL/min/{1.73_m2} — ABNORMAL LOW (ref >59)
Globulin, Total: 2.5 g/dL (ref 1.5–4.5)
Glucose: 96 mg/dL (ref 65–99)
Potassium: 4.7 mmol/L (ref 3.5–5.2)
Sodium: 139 mmol/L (ref 134–144)
Total Protein: 7.3 g/dL (ref 6.0–8.5)

## 2020-03-06 LAB — LIPID PANEL
Chol/HDL Ratio: 3.4 ratio (ref 0.0–5.0)
Cholesterol, Total: 168 mg/dL (ref 100–199)
HDL: 50 mg/dL (ref >39)
LDL Chol Calc (NIH): 103 mg/dL — ABNORMAL HIGH (ref 0–99)
Triglycerides: 82 mg/dL (ref 0–149)
VLDL Cholesterol Cal: 15 mg/dL (ref 5–40)

## 2020-03-06 LAB — TSH: TSH: 3.87 u[IU]/mL (ref 0.450–4.500)

## 2020-03-06 LAB — PSA: Prostate Specific Ag, Serum: 6.4 ng/mL — ABNORMAL HIGH (ref 0.0–4.0)

## 2020-03-29 ENCOUNTER — Encounter: Payer: Self-pay | Admitting: Family Medicine

## 2020-04-03 ENCOUNTER — Other Ambulatory Visit: Payer: Self-pay | Admitting: Family Medicine

## 2020-04-03 DIAGNOSIS — I10 Essential (primary) hypertension: Secondary | ICD-10-CM

## 2020-06-28 ENCOUNTER — Other Ambulatory Visit: Payer: Self-pay | Admitting: Family Medicine

## 2020-06-28 DIAGNOSIS — E7800 Pure hypercholesterolemia, unspecified: Secondary | ICD-10-CM

## 2020-12-06 ENCOUNTER — Encounter: Payer: Self-pay | Admitting: Family Medicine

## 2021-01-09 ENCOUNTER — Telehealth: Payer: Self-pay | Admitting: Family Medicine

## 2021-01-09 ENCOUNTER — Other Ambulatory Visit: Payer: Self-pay

## 2021-01-09 MED ORDER — LISINOPRIL-HYDROCHLOROTHIAZIDE 10-12.5 MG PO TABS: 1.0000 | ORAL_TABLET | Freq: Every day | ORAL | 3 refills | Status: DC

## 2021-01-09 NOTE — Telephone Encounter (Signed)
Converted to refill  and completed

## 2021-01-09 NOTE — Telephone Encounter (Signed)
[facility] faxed refill request for the following medications:  lisinopril-hydrochlorothiazide (ZESTORETIC) 10-12.5 MG tablet   Please advise.  

## 2021-03-18 ENCOUNTER — Other Ambulatory Visit: Payer: Self-pay | Admitting: Family Medicine

## 2021-03-18 DIAGNOSIS — E7800 Pure hypercholesterolemia, unspecified: Secondary | ICD-10-CM

## 2021-03-18 MED ORDER — LOVASTATIN 40 MG PO TABS: 40.0000 mg | ORAL_TABLET | Freq: Every day | ORAL | 0 refills | Status: DC

## 2021-03-18 NOTE — Telephone Encounter (Signed)
My chart message sent to patient letting him know that he is due to for follow up appointment/cpe.

## 2021-03-18 NOTE — Telephone Encounter (Signed)
[facility] faxed refill request for the following medications:  lovastatin (MEVACOR) 40 MG tablet    Please advise.

## 2021-04-14 ENCOUNTER — Other Ambulatory Visit: Payer: Self-pay | Admitting: Family Medicine

## 2021-04-14 DIAGNOSIS — I10 Essential (primary) hypertension: Secondary | ICD-10-CM

## 2021-04-14 NOTE — Telephone Encounter (Signed)
°   Requested medication (s) are on the active medication list: yes  Future visit scheduled: No, looks like transferring to [facility] but not until April.  Notes to clinic:  Was Dr. Natale Milch pt, last seen in 2020, appt with different practice but not until April, please assess.  Requested Prescriptions  Pending Prescriptions Disp Refills   metoprolol succinate (TOPROL-XL) 25 MG 24 hr tablet 90 tablet 3    Sig: Take 1 tablet (25 mg total) by mouth daily.     Cardiovascular:  Beta Blockers Failed - 04/14/2021 12:47 PM      Failed - Valid encounter within last 6 months    Recent Outpatient Visits           2 years ago Blackout spell   [facility], Vickki Muff, PA-C   3 years ago Essential (primary) hypertension   [facility], Vickki Muff, PA-C   4 years ago Acute pharyngitis, unspecified etiology   [facility], Vickki Muff, PA-C   4 years ago Chronic left shoulder pain   [facility], Vickki Muff, PA-C   5 years ago Pharyngitis   [place], PA-C       Future Appointments             In 2 months Parks Ranger, Devonne Doughty, DO [facility], [address] BP in normal range    BP Readings from Last 1 Encounters:  03/05/20 130/88          Passed - Last Heart Rate in normal range    Pulse Readings from Last 1 Encounters:  03/05/20 65

## 2021-04-14 NOTE — Telephone Encounter (Signed)
Medication Refill - Medication: metoprolol succinate (TOPROL-XL) 25 MG 24 hr tablet  Has the patient contacted their pharmacy? No. No, more refills.   (Agent: If no, request that the patient contact the pharmacy for the refill. If patient does not wish to contact the pharmacy document the reason why and proceed with request.)   Preferred Pharmacy (with phone number or street name):  [id number] [place] [address]  [address] [place] [address]  Phone: [phone] Fax: [phone]  Hours: Not open 24 hours   Has the patient been seen for an appointment in the last year OR does the patient have an upcoming appointment? No. Pt has a new patient appointment at [facility] with Dr. Parks Ranger.   Agent: Please be advised that RX refills may take up to 3 business days. We ask that you follow-up with your pharmacy.

## 2021-04-15 MED ORDER — METOPROLOL SUCCINATE ER 25 MG PO TB24: 25.0000 mg | ORAL_TABLET | Freq: Every day | ORAL | 3 refills | Status: DC

## 2021-04-25 ENCOUNTER — Ambulatory Visit: Payer: Medicare PPO | Admitting: Family Medicine

## 2021-04-25 ENCOUNTER — Other Ambulatory Visit: Payer: Self-pay

## 2021-04-25 ENCOUNTER — Encounter: Payer: Self-pay | Admitting: Family Medicine

## 2021-04-25 VITALS — BP 140/90 | HR 64 | Ht 69.0 in | Wt 180.6 lb

## 2021-04-25 DIAGNOSIS — N401 Enlarged prostate with lower urinary tract symptoms: Secondary | ICD-10-CM | POA: Insufficient documentation

## 2021-04-25 DIAGNOSIS — I10 Essential (primary) hypertension: Secondary | ICD-10-CM

## 2021-04-25 DIAGNOSIS — R351 Nocturia: Secondary | ICD-10-CM

## 2021-04-25 DIAGNOSIS — R972 Elevated prostate specific antigen [PSA]: Secondary | ICD-10-CM | POA: Diagnosis not present

## 2021-04-25 DIAGNOSIS — Z7689 Persons encountering health services in other specified circumstances: Secondary | ICD-10-CM

## 2021-04-25 NOTE — Patient Instructions (Addendum)
Thank you for coming to the office today.  Pneumovax 23 or was it TWSFKCL-27 (PCV20) newest version came out in 2022.  Saw [place]metto 80 twice a day, and can increase if needed in future.  Future consider Flomax if needed.  If PSA rising further, consider urology consultation.  ---------  Keep track of BP at home, check occasional readings, different times a day.  Goal < 140 / 90  For now, keep on same dosing regimen you are- take lisinopril-HCTZ every other day, and take the Metoprolol XL 25mg  daily.  I am trying to figure out if we can get rid of either the HCTZ or the Metoprolol, we will work on this going forward once I have a better sense of your BP readings. Especially on days you do or dont take the [place].  If readings are reasonable 140 / 90 or less range, then we can try HOLDING the metoprolol completely And taking the Lisinopril -HCTZ every day  If that doesn't work, then next plan - return to the Metoprolol and we can split dose the Lisinopril-HCTZ and end up discontinuing JUST the HCTZ.  Sleep Hygiene Recommendations to promote healthy sleep in all patients, especially if symptoms of insomnia are worsening. Due to the nature of sleep rhythms, if your body gets "out of rhythm", it may take some time before your sleep cycle can be "reset".  Please try to follow as many of the following tips as you can, usually there are only a few of these are the primary cause of the problem.  ?To reset your sleep rhythm, go to bed and get up at the same time every day ?Sleep only long enough to feel rested and then get out of bed ?Do not try to force yourself to sleep. If you can't sleep, get out of bed and try again later. ?Avoid naps during the day, unless excessively tired. The more sleeping during the day, then the less sleep your body needs at night.  ?Have coffee, tea, and other foods that have caffeine only in the morning ?Exercise several days a week, but not right  before bed ?If you drink alcohol, prefer to have appropriate drink with one meal, but prefer to avoid alcohol in the evening, and bedtime ?If you smoke, avoid smoking, especially in the evening  ?Avoid watching TV or looking at phones, computers, or reading devices ("e-books") that give off light at least 30 minutes before bed. This artificial light sends "awake signals" to your brain and can make it harder to fall asleep. ?Make your bedroom a comfortable place where it is easy to fall asleep: Put up shades or special blackout curtains to block light from outside. Use a white noise machine to block noise. Keep the temperature cool. ?Try your best to solve or at least address your problems before you go to bed ?Use relaxation techniques to manage stress. Ask your health care provider to suggest some techniques that may work well for you. These may include: Breathing exercises. Routines to release muscle tension. Visualizing peaceful scenes.    DUE for FASTING BLOOD WORK (no food or drink after midnight before the lab appointment, only water or coffee without cream/sugar on the morning of)  SCHEDULE "Lab Only" visit in the morning at the clinic for lab draw in 4 WEEKS   - Make sure Lab Only appointment is at about 1 week before your next appointment, so that results will be available  For Lab Results, once available within 2-3 days  of blood draw, you can can log in to MyChart online to view your results and a brief explanation. Also, we can discuss results at next follow-up visit.   Please schedule a Follow-up Appointment to: Return in about 4 weeks (around 05/23/2021) for 3-4 weeks fasting lab only then 1 week later Annual Physical.  If you have any other questions or concerns, please feel free to call the office or send a message through [place]. You may also schedule an earlier appointment if necessary.  Additionally, you may be receiving a survey about your experience at our office  within a few days to 1 week by e-mail or mail. We value your feedback.  Nobie Putnam, DO [place]

## 2021-04-25 NOTE — Progress Notes (Signed)
Subjective:    Patient ID: Warren Jackson, male    DOB: 08-09-1950, 71 y.o.   MRN: [id number]  Warren Jackson is a 71 y.o. male presenting on 04/25/2021 for Establish Care  Previously moved to [facility] in 1990. He is transferring care now from previous PCP Niederwald PA at [facility].  HPI  Elevated PSA BPH Prior diagnosis since 1990.  Recent history of PSA elevated 6.4 03/2020, prior ranges 4.6 to 5.0 over past 4 years Never on medication Limited or no caffeine during day Drinks mostly flavored water. No soft drinks. Admits some dark chocolate intake w/ caffeine Limits drinks after 8pm. Very rarely consuming alcohol  AUA BPH Symptom Score over past 1 month 1. Sensation of not emptying bladder post void - 1 2. Urinate less than 2 hour after finish last void - 1 3. Start/Stop several times during void - 2 4. Difficult to postpone urination - 1 5. Weak urinary stream - 2 6. Push or strain urination - 0 7. Nocturia - 2 times  Total Score: 9 (Moderate BPH symptoms)   CHRONIC HTN: Reports elevated BP today here. Home BP readings none recently. Current Meds - Lisinopril-HCTZ 10-12.5mg  every other day, Metoprolol XL 25mg  daily   Reports good compliance, did not take Lisinopril today. Tolerating well, w/o complaints. Walking daily for exercise Admits postural dizziness  Denies CP, dyspnea, HA, edema, dizziness / lightheadedness   HYPERLIPIDEMIA: - Reports concerns. Previously had elevated lipid lovastatin 20mg  up to 40mg  last year - Currently taking Lovastatin 40mg , tolerating well without side effects or myalgias  Insomnia He had CPAP previously, he was taking it off and turning Had a sleep study previously and inaccurate  He takes OTC Diphenhydramine PRN as sleep aid PRN, able to fall asleep fairly well, and he can go back to sleep after waking up overnight Has tried Ambien in the past. He typically takes a nap in the afternoon, as a pattern.   Depression screen [facility] 2/9  03/05/2020 02/02/2019 09/25/2016  Decreased Interest 0 0 0  Down, Depressed, Hopeless 0 0 0  PHQ - 2 Score 0 0 0  Altered sleeping 1 - 1  Tired, decreased energy 0 - -  Change in appetite 0 - 0  Feeling bad or failure about yourself  0 - 0  Trouble concentrating 0 - 0  Moving slowly or fidgety/restless 0 - 0  Suicidal thoughts 0 - 0  PHQ-9 Score 1 - 1  Difficult doing work/chores Not difficult at all - Not difficult at all    Past Medical History:  Diagnosis Date   Allergy    Bone spur    Elevated PSA    Hernia 03/16/2012   Hyperlipidemia    Hypertension    Past Surgical History:  Procedure Laterality Date   BACK SURGERY  2008   KNEE ARTHROSCOPY Left    KNEE SURGERY Left [id number]   TONSILLECTOMY AND ADENOIDECTOMY     Social History   Socioeconomic History   Marital status: Married    Spouse name: Not on file   Number of children: Not on file   Years of education: Not on file   Highest education level: Not on file  Occupational History   Not on file  Tobacco Use   Smoking status: Never   Smokeless tobacco: Never  Vaping Use   Vaping Use: Never used  Substance and Sexual Activity   Alcohol use: Yes    Comment: rarely   Drug use:  No   Sexual activity: Not on file  Other Topics Concern   Not on file  Social History Narrative   Not on file   Social Determinants of Health   Financial Resource Strain: Not on file  Food Insecurity: Not on file  Transportation Needs: Not on file  Physical Activity: Not on file  Stress: Not on file  Social Connections: Not on file  Intimate Partner Violence: Not on file   Family History  Problem Relation Age of Onset   Peptic Ulcer Disease Mother    Cancer Mother    Heart attack Father    Dementia Maternal Grandmother    Diabetes Maternal Grandfather    Heart attack Paternal Grandmother    CVA Paternal Grandmother    Alcohol abuse Paternal Grandfather    Healthy Daughter    Current Outpatient Medications on File  Prior to Visit  Medication Sig   aspirin 81 MG tablet Take 81 mg by mouth daily.   diphenhydrAMINE HCl, Sleep, 50 MG CAPS Take 1 capsule by mouth at bedtime as needed.   lisinopril-hydrochlorothiazide (ZESTORETIC) 10-12.5 MG tablet Take 1 tablet by mouth daily.   loratadine (CLARITIN) 10 MG tablet Take 10 mg by mouth daily as needed for allergies.   lovastatin (MEVACOR) 40 MG tablet Take 1 tablet (40 mg total) by mouth at bedtime.   metoprolol succinate (TOPROL-XL) 25 MG 24 hr tablet Take 1 tablet (25 mg total) by mouth daily.   No current facility-administered medications on file prior to visit.    Review of Systems Per HPI unless specifically indicated above      Objective:    BP 140/90 (BP Location: Left Arm, Cuff Size: Normal)    Pulse 64    Ht 5\' 9"  (1.753 m)    Wt 180 lb 9.6 oz (81.9 kg)    SpO2 100%    BMI 26.67 kg/m   Wt Readings from Last 3 Encounters:  04/25/21 180 lb 9.6 oz (81.9 kg)  03/05/20 178 lb (80.7 kg)  02/02/19 184 lb (83.5 kg)    Physical Exam Vitals and nursing note reviewed.  Constitutional:      General: He is not in acute distress.    Appearance: Normal appearance. He is well-developed. He is not diaphoretic.     Comments: Well-appearing, comfortable, cooperative  HENT:     Head: Normocephalic and atraumatic.  Eyes:     General:        Right eye: No discharge.        Left eye: No discharge.     Conjunctiva/sclera: Conjunctivae normal.  Cardiovascular:     Rate and Rhythm: Normal rate.  Pulmonary:     Effort: Pulmonary effort is normal.  Skin:    General: Skin is warm and dry.     Findings: No erythema or rash.  Neurological:     Mental Status: He is alert and oriented to person, place, and time.  Psychiatric:        Mood and Affect: Mood normal.        Behavior: Behavior normal.        Thought Content: Thought content normal.     Comments: Well groomed, good eye contact, normal speech and thoughts     Results for orders placed or  performed in visit on 03/05/20  CBC with Differential/Platelet  Result Value Ref Range   WBC 9.2 3.4 - 10.8 x10E3/uL   RBC 5.85 (H) 4.14 - 5.80 x10E6/uL   Hemoglobin 17.1 13.0 -  17.7 g/dL   Hematocrit 50.8 37.5 - 51.0 %   MCV 87 79 - 97 fL   MCH 29.2 26.6 - 33.0 pg   MCHC 33.7 31.5 - 35.7 g/dL   RDW 12.6 11.6 - 15.4 %   Platelets 246 150 - 450 x10E3/uL   Neutrophils 72 Not Estab. %   Lymphs 20 Not Estab. %   Monocytes 6 Not Estab. %   Eos 2 Not Estab. %   Basos 0 Not Estab. %   Neutrophils Absolute 6.6 1.4 - 7.0 x10E3/uL   Lymphocytes Absolute 1.8 0.7 - 3.1 x10E3/uL   Monocytes Absolute 0.6 0.1 - 0.9 x10E3/uL   EOS (ABSOLUTE) 0.2 0.0 - 0.4 x10E3/uL   Basophils Absolute 0.0 0.0 - 0.2 x10E3/uL   Immature Granulocytes 0 Not Estab. %   Immature Grans (Abs) 0.0 0.0 - 0.1 x10E3/uL  Comprehensive metabolic panel  Result Value Ref Range   Glucose 96 65 - 99 mg/dL   BUN 28 (H) 8 - 27 mg/dL   Creatinine, Ser 1.27 0.76 - 1.27 mg/dL   GFR calc non Af Amer 58 (L) >59 mL/min/1.73   GFR calc Af Amer 67 >59 mL/min/1.73   BUN/Creatinine Ratio 22 10 - 24   Sodium 139 134 - 144 mmol/L   Potassium 4.7 3.5 - 5.2 mmol/L   Chloride 99 96 - 106 mmol/L   CO2 26 20 - 29 mmol/L   Calcium 10.1 8.6 - 10.2 mg/dL   Total Protein 7.3 6.0 - 8.5 g/dL   Albumin 4.8 3.8 - 4.8 g/dL   Globulin, Total 2.5 1.5 - 4.5 g/dL   Albumin/Globulin Ratio 1.9 1.2 - 2.2   Bilirubin Total 0.9 0.0 - 1.2 mg/dL   Alkaline Phosphatase 67 44 - 121 IU/L   AST 25 0 - 40 IU/L   ALT 19 0 - 44 IU/L  Lipid panel  Result Value Ref Range   Cholesterol, Total 168 100 - 199 mg/dL   Triglycerides 82 0 - 149 mg/dL   HDL 50 >39 mg/dL   VLDL Cholesterol Cal 15 5 - 40 mg/dL   LDL Chol Calc (NIH) 103 (H) 0 - 99 mg/dL   Chol/HDL Ratio 3.4 0.0 - 5.0 ratio  PSA  Result Value Ref Range   Prostate Specific Ag, Serum 6.4 (H) 0.0 - 4.0 ng/mL  TSH  Result Value Ref Range   TSH 3.870 0.450 - 4.500 uIU/mL      Assessment & Plan:    Problem List Items Addressed This Visit     Essential (primary) hypertension   Benign prostatic hyperplasia with nocturia - Primary   Other Visit Diagnoses     Elevated PSA, less than 10 ng/ml       Encounter to establish care with new doctor           Establish care, review past records.  Elevated PSA BPH likely etiology Last PSA up to 6.4 (02/2020) prior trend 4.6 to 5 over past 4 yrs Will repeat labs at upcoming physical within 1 month approx  BPH LUTS Chronic problem Mild to moderate symptoms No prior therapy Trial on Saw Palmetto OTC herbal supplement as discussed Consider FLomax as next option Future Urologist consult as indicated  HTN Elevated BP initially, repeat manual improved, but still above goal. Discussion on adjusting his medication in near future Given he is currently taking intermittent dosing Lisinopril-HCTZ every other day, suggested he can keep on that regimen and monitor BP at home. I suspect days like  today when not taking the Lisinopril-HCTZ would explain elevated BP Considering options going forward if would DC Metoprolol if not providing any other benefit other than BP management, or drop HCTZ if he has inc urination already from BPH. We can review at upcoming visit.    No orders of the defined types were placed in this encounter.     Follow up plan: Return in about 4 weeks (around 05/23/2021) for 3-4 weeks fasting lab only then 1 week later Annual Physical.  Future labs CMET CBC Lipid PSA A1c   Nobie Putnam, DO [facility] 04/25/2021, 3:22 PM

## 2021-04-26 ENCOUNTER — Other Ambulatory Visit: Payer: Self-pay | Admitting: Family Medicine

## 2021-04-26 DIAGNOSIS — E7800 Pure hypercholesterolemia, unspecified: Secondary | ICD-10-CM

## 2021-04-26 DIAGNOSIS — R351 Nocturia: Secondary | ICD-10-CM

## 2021-04-26 DIAGNOSIS — N401 Enlarged prostate with lower urinary tract symptoms: Secondary | ICD-10-CM

## 2021-04-26 DIAGNOSIS — R972 Elevated prostate specific antigen [PSA]: Secondary | ICD-10-CM

## 2021-04-26 DIAGNOSIS — Z0000 Encounter for general adult medical examination without abnormal findings: Secondary | ICD-10-CM

## 2021-04-26 DIAGNOSIS — R7309 Other abnormal glucose: Secondary | ICD-10-CM

## 2021-04-26 DIAGNOSIS — I10 Essential (primary) hypertension: Secondary | ICD-10-CM

## 2021-04-27 ENCOUNTER — Encounter: Payer: Self-pay | Admitting: Family Medicine

## 2021-05-02 ENCOUNTER — Ambulatory Visit: Payer: Medicare PPO | Admitting: Family Medicine

## 2021-05-23 ENCOUNTER — Other Ambulatory Visit: Payer: Medicare PPO

## 2021-05-23 ENCOUNTER — Other Ambulatory Visit: Payer: Self-pay

## 2021-05-23 DIAGNOSIS — R972 Elevated prostate specific antigen [PSA]: Secondary | ICD-10-CM

## 2021-05-23 DIAGNOSIS — E7800 Pure hypercholesterolemia, unspecified: Secondary | ICD-10-CM

## 2021-05-23 DIAGNOSIS — Z0000 Encounter for general adult medical examination without abnormal findings: Secondary | ICD-10-CM

## 2021-05-23 DIAGNOSIS — N401 Enlarged prostate with lower urinary tract symptoms: Secondary | ICD-10-CM

## 2021-05-23 DIAGNOSIS — R351 Nocturia: Secondary | ICD-10-CM

## 2021-05-23 DIAGNOSIS — R7309 Other abnormal glucose: Secondary | ICD-10-CM

## 2021-05-23 DIAGNOSIS — I10 Essential (primary) hypertension: Secondary | ICD-10-CM

## 2021-05-26 ENCOUNTER — Other Ambulatory Visit: Payer: Medicare PPO

## 2021-05-26 ENCOUNTER — Other Ambulatory Visit: Payer: Self-pay

## 2021-05-27 LAB — CBC WITH DIFFERENTIAL/PLATELET
Absolute Monocytes: 445 cells/uL (ref 200–950)
Basophils Absolute: 22 cells/uL (ref 0–200)
Basophils Relative: 0.3 %
Eosinophils Absolute: 219 cells/uL (ref 15–500)
Eosinophils Relative: 3 %
HCT: 51.2 % — ABNORMAL HIGH (ref 38.5–50.0)
Hemoglobin: 17 g/dL (ref 13.2–17.1)
Lymphs Abs: 1000 cells/uL (ref 850–3900)
MCH: 29.2 pg (ref 27.0–33.0)
MCHC: 33.2 g/dL (ref 32.0–36.0)
MCV: 88 fL (ref 80.0–100.0)
MPV: 10.2 fL (ref 7.5–12.5)
Monocytes Relative: 6.1 %
Neutro Abs: 5614 cells/uL (ref 1500–7800)
Neutrophils Relative %: 76.9 %
Platelets: 206 10*3/uL (ref 140–400)
RBC: 5.82 10*6/uL — ABNORMAL HIGH (ref 4.20–5.80)
RDW: 13.1 % (ref 11.0–15.0)
Total Lymphocyte: 13.7 %
WBC: 7.3 10*3/uL (ref 3.8–10.8)

## 2021-05-27 LAB — LIPID PANEL
Cholesterol: 185 mg/dL (ref <200)
HDL: 50 mg/dL (ref >=40)
LDL Cholesterol (Calc): 110 mg/dL (calc) — ABNORMAL HIGH
Non-HDL Cholesterol (Calc): 135 mg/dL (calc) — ABNORMAL HIGH (ref <130)
Total CHOL/HDL Ratio: 3.7 (calc) (ref <5.0)
Triglycerides: 140 mg/dL (ref <150)

## 2021-05-27 LAB — COMPLETE METABOLIC PANEL WITH GFR
AG Ratio: 1.7 (calc) (ref 1.0–2.5)
ALT: 17 U/L (ref 9–46)
AST: 25 U/L (ref 10–35)
Albumin: 4.5 g/dL (ref 3.6–5.1)
Alkaline phosphatase (APISO): 55 U/L (ref 35–144)
BUN: 22 mg/dL (ref 7–25)
CO2: 26 mmol/L (ref 20–32)
Calcium: 9.8 mg/dL (ref 8.6–10.3)
Chloride: 104 mmol/L (ref 98–110)
Creat: 1.05 mg/dL (ref 0.70–1.35)
Globulin: 2.7 g/dL (calc) (ref 1.9–3.7)
Glucose, Bld: 94 mg/dL (ref 65–99)
Potassium: 4 mmol/L (ref 3.5–5.3)
Sodium: 141 mmol/L (ref 135–146)
Total Bilirubin: 1 mg/dL (ref 0.2–1.2)
Total Protein: 7.2 g/dL (ref 6.1–8.1)
eGFR: 77 mL/min/{1.73_m2} (ref >=60)

## 2021-05-27 LAB — TSH: TSH: 4.53 mIU/L — ABNORMAL HIGH (ref 0.40–4.50)

## 2021-05-27 LAB — HEMOGLOBIN A1C
Hgb A1c MFr Bld: 5.6 % of total Hgb (ref <5.7)
Mean Plasma Glucose: 114 mg/dL
eAG (mmol/L): 6.3 mmol/L

## 2021-05-27 LAB — PSA: PSA: 4.5 ng/mL — ABNORMAL HIGH (ref <=4.00)

## 2021-05-30 ENCOUNTER — Ambulatory Visit (INDEPENDENT_AMBULATORY_CARE_PROVIDER_SITE_OTHER): Payer: Medicare PPO | Admitting: Family Medicine

## 2021-05-30 ENCOUNTER — Encounter: Payer: Self-pay | Admitting: Family Medicine

## 2021-05-30 ENCOUNTER — Other Ambulatory Visit: Payer: Self-pay

## 2021-05-30 ENCOUNTER — Ambulatory Visit (INDEPENDENT_AMBULATORY_CARE_PROVIDER_SITE_OTHER): Payer: Medicare PPO

## 2021-05-30 ENCOUNTER — Other Ambulatory Visit: Payer: Self-pay | Admitting: Family Medicine

## 2021-05-30 VITALS — BP 130/88 | HR 64 | Ht 69.0 in | Wt 181.0 lb

## 2021-05-30 DIAGNOSIS — E7800 Pure hypercholesterolemia, unspecified: Secondary | ICD-10-CM | POA: Diagnosis not present

## 2021-05-30 DIAGNOSIS — N401 Enlarged prostate with lower urinary tract symptoms: Secondary | ICD-10-CM

## 2021-05-30 DIAGNOSIS — R972 Elevated prostate specific antigen [PSA]: Secondary | ICD-10-CM

## 2021-05-30 DIAGNOSIS — Z0000 Encounter for general adult medical examination without abnormal findings: Secondary | ICD-10-CM

## 2021-05-30 DIAGNOSIS — R351 Nocturia: Secondary | ICD-10-CM

## 2021-05-30 DIAGNOSIS — I10 Essential (primary) hypertension: Secondary | ICD-10-CM | POA: Diagnosis not present

## 2021-05-30 NOTE — Progress Notes (Signed)
? ?Subjective:  ? Warren Jackson is a 71 y.o. male who presents for Medicare Annual/Subsequent preventive examination. ? ?Review of Systems    ? ?  ? ?   ?Objective:  ?  ?Today's Vitals  ? 05/30/21 1401  ?PainSc: 0-No pain  ? ?There is no height or weight on file to calculate BMI. ? ?No flowsheet data found. ? ?Current Medications (verified) ?Outpatient Encounter Medications as of 05/30/2021  ?Medication Sig  ? aspirin 81 MG tablet Take 81 mg by mouth daily.  ? diphenhydrAMINE HCl, Sleep, 50 MG CAPS Take 1 capsule by mouth at bedtime as needed.  ? lisinopril-hydrochlorothiazide (ZESTORETIC) 10-12.5 MG tablet Take 1 tablet by mouth daily.  ? loratadine (CLARITIN) 10 MG tablet Take 10 mg by mouth daily as needed for allergies.  ? lovastatin (MEVACOR) 40 MG tablet Take 1 tablet (40 mg total) by mouth at bedtime.  ? metoprolol succinate (TOPROL-XL) 25 MG 24 hr tablet Take 1 tablet (25 mg total) by mouth daily.  ? ?No facility-administered encounter medications on file as of 05/30/2021.  ? ? ?Allergies (verified) ?Other and Penicillins  ? ?History: ?Past Medical History:  ?Diagnosis Date  ? Allergy   ? Bone spur   ? Elevated PSA   ? Hernia 03/16/2012  ? Hyperlipidemia   ? Hypertension   ? ?Past Surgical History:  ?Procedure Laterality Date  ? BACK SURGERY  2008  ? KNEE ARTHROSCOPY Left   ? KNEE SURGERY Left [id number]  ? TONSILLECTOMY AND ADENOIDECTOMY    ? ?Family History  ?Problem Relation Age of Onset  ? Peptic Ulcer Disease Mother   ? Cancer Mother   ? Heart attack Father   ? Dementia Maternal Grandmother   ? Diabetes Maternal Grandfather   ? Heart attack Paternal Grandmother   ? CVA Paternal Grandmother   ? Alcohol abuse Paternal Grandfather   ? Healthy Daughter   ? ?Social History  ? ?Socioeconomic History  ? Marital status: Married  ?  Spouse name: Not on file  ? Number of children: Not on file  ? Years of education: Not on file  ? Highest education level: Not on file  ?Occupational History  ? Not on file  ?Tobacco  Use  ? Smoking status: Never  ? Smokeless tobacco: Never  ?Vaping Use  ? Vaping Use: Never used  ?Substance and Sexual Activity  ? Alcohol use: Yes  ?  Comment: rarely  ? Drug use: No  ? Sexual activity: Not on file  ?Other Topics Concern  ? Not on file  ?Social History Narrative  ? Not on file  ? ?Social Determinants of Health  ? ?Financial Resource Strain: Not on file  ?Food Insecurity: Not on file  ?Transportation Needs: Not on file  ?Physical Activity: Not on file  ?Stress: Not on file  ?Social Connections: Not on file  ? ? ?Tobacco Counseling ?Counseling given: Not Answered ? ? ?Clinical Intake: ? ?Pre-visit preparation completed: Yes ? ?Pain : No/denies pain ?Pain Score: 0-No pain ? ?  ? ?Nutritional Risks: None ?Diabetes: No ? ?How often do you need to have someone help you when you read instructions, pamphlets, or other written materials from your doctor or pharmacy?: 1 - Never ? ?Diabetic?no ? ?Interpreter Needed?: No ? ?Information entered by :: Kirke Shaggy, LPN ? ? ?Activities of Daily Living ?No flowsheet data found. ? ?Patient Care Team: ?Olin Hauser, DO as PCP - General (Family Medicine) ?Robert Bellow, MD (General Surgery) ? ?Indicate  any recent Medical Services you may have received from other than [facility] providers in the past year (date may be approximate). ? ?   ?Assessment:  ? This is a routine wellness examination for Warren Jackson. ? ?Hearing/Vision screen ?No results found. ? ?Dietary issues and exercise activities discussed: ?  ? ? Goals Addressed   ?None ?  ? ?Depression Screen ?PHQ 2/9 Scores 03/05/2020 02/02/2019 09/25/2016  ?PHQ - 2 Score 0 0 0  ?PHQ- 9 Score 1 - 1  ?  ?Fall Risk ?Fall Risk  03/05/2020 02/02/2019 09/25/2016  ?Falls in the past year? 0 0 No  ?Number falls in past yr: 0 0 -  ?Injury with Fall? 0 0 -  ?Follow up Falls evaluation completed - -  ? ? ?FALL RISK PREVENTION PERTAINING TO THE HOME: ? ?Any stairs in or around the home? Yes  ?If so, are there any without  handrails? No  ?Home free of loose throw rugs in walkways, pet beds, electrical cords, etc? Yes  ?Adequate lighting in your home to reduce risk of falls? Yes  ? ?ASSISTIVE DEVICES UTILIZED TO PREVENT FALLS: ? ?Life alert? No  ?Use of a cane, walker or w/c? No  ?Grab bars in the bathroom? Yes  ?Shower chair or bench in shower? Yes  ?Elevated toilet seat or a handicapped toilet? Yes  ? ?TIMED UP AND GO: ? ?Was the test performed? Yes .  ?Length of time to ambulate 10 feet: 4 sec.  ? ?Gait steady and fast without use of assistive device ? ?Cognitive Function: ?  ?  ?  ? ?Immunizations ?Immunization History  ?Administered Date(s) Administered  ? Influenza Split 12/18/2011  ? Influenza, High Dose Seasonal PF 11/23/2017, 11/03/2018, 12/01/2019, 12/25/2020  ? Influenza-Unspecified 11/26/2016, 11/03/2018, 12/01/2019  ? PFIZER(Purple Top)SARS-COV-2 Vaccination 05/09/2019, 05/30/2019, 01/04/2020  ? Pension scheme manager 58yr & up 01/02/2021  ? Pneumococcal Conjugate-13 02/25/2017, 11/03/2018  ? Tdap 02/25/2017  ? Zoster Recombinat (Shingrix) 10/30/2017, 11/10/2018  ? Zoster, Live 05/17/2012  ? ? ?TDAP status: Up to date ? ?Flu Vaccine status: Up to date ? ?Pneumococcal vaccine status: Up to date ? ?Covid-19 vaccine status: Completed vaccines ? ?Qualifies for Shingles Vaccine? Yes   ?Zostavax completed Yes   ?Shingrix Completed?: Yes ? ?Screening Tests ?Health Maintenance  ?Topic Date Due  ? Pneumonia Vaccine 70 Years old (2 - PPSV23 if available, else PCV20) 11/03/2019  ? COLONOSCOPY (Pts [phone] coverage will need to be confirmed)  09/08/2022  ? TETANUS/TDAP  02/26/2027  ? INFLUENZA VACCINE  Completed  ? COVID-19 Vaccine  Completed  ? Hepatitis C Screening  Completed  ? Zoster Vaccines- Shingrix  Completed  ? HPV VACCINES  Aged Out  ? ? ?Health Maintenance ? ?Health Maintenance Due  ?Topic Date Due  ? Pneumonia Vaccine 6550Years old (2 - PPSV23 if available, else PCV20) 11/03/2019   ? ? ?Colorectal cancer screening: Type of screening: Colonoscopy. Completed 09/07/12. Repeat every 10 years ? ?Lung Cancer Screening: (Low Dose CT Chest recommended if Age 649-80ears, 30 pack-year currently smoking OR have quit w/in 15years.) does not qualify.  ? ?Additional Screening: ? ?Hepatitis C Screening: does qualify; Completed 04/01/17 ? ?Vision Screening: Recommended annual ophthalmology exams for early detection of glaucoma and other disorders of the eye. ?Is the patient up to date with their annual eye exam?  Yes  ?Who is the provider or what is the name of the office in which the patient attends annual eye exams? [place]  If pt is not established with a provider, would they like to be referred to a provider to establish care? No .  ? ?Dental Screening: Recommended annual dental exams for proper oral hygiene ? ?Community Resource Referral / Chronic Care Management: ?CRR required this visit?  No  ? ?CCM required this visit?  No  ? ? ?  ?Plan:  ?  ? ?I have personally reviewed and noted the following in the patient?s chart:  ? ?Medical and social history ?Use of alcohol, tobacco or illicit drugs  ?Current medications and supplements including opioid prescriptions. Patient is not currently taking opioid prescriptions. ?Functional ability and status ?Nutritional status ?Physical activity ?Advanced directives ?List of other physicians ?Hospitalizations, surgeries, and ER visits in previous 12 months ?Vitals ?Screenings to include cognitive, depression, and falls ?Referrals and appointments ? ?In addition, I have reviewed and discussed with patient certain preventive protocols, quality metrics, and best practice recommendations. A written personalized care plan for preventive services as well as general preventive health recommendations were provided to patient. ?  ? ? ?Dionisio David, LPN   1/61/0960  ? ?Nurse Notes: none ? ? ? ?

## 2021-05-30 NOTE — Progress Notes (Signed)
? ?Subjective:  ? ? Patient ID: Warren Jackson, male    DOB: Apr 30, 1950, 71 y.o.   MRN: [id number] ? ?Warren Jackson is a 71 y.o. male presenting on 05/30/2021 for Annual Exam ? ? ?HPI ? ?Here for Annual Physical and Lab Review. Also he has AMW with Dionisio David LPN today. ? ? ?Elevated PSA ?BPH ?Prior diagnosis since 1990 ?Last PSA 4.5 (05/2021), prior history of PSA elevated 6.4 03/2020, prior ranges 4.6 to 5.0 over past 4 years ?Never on medication ?No prior prostate biopsy. ?He will try saw palmetto ?   ?CHRONIC HTN: ?Reports elevated BP today here. ?Home BP readings none recently. ?Current Meds - Lisinopril-HCTZ 10-12.'5mg'$  every other day, Metoprolol XL '25mg'$  daily   ?Reports good compliance. Tolerating well, w/o complaints. ?Walking daily for exercise ?Admits postural dizziness  ?Denies CP, dyspnea, HA, edema, dizziness / lightheadedness ? ?Home BP checks ?139/86, HR 59 ?161/98, HR 58 (took diuretic as well) > repeat 154/96 ?138/86 ?137/89 ?147/86 ?130/78 (BP and diuretic) ?109/68 > 102/65 ?123/80, HR 70 ?140/87 (BP and diuretic) ?  ?HYPERLIPIDEMIA: ?- Reports concerns ?Lipid panel shows LDL 110 ?- Currently taking Lovastatin '40mg'$ , tolerating well without side effects or myalgias ?  ?Insomnia ?He had CPAP previously, he was taking it off and turning ?Had a sleep study previously and inaccurate  ?He takes OTC Diphenhydramine PRN as sleep aid PRN, able to fall asleep fairly well, and he can go back to sleep after waking up overnight ?Has tried Ambien in the past. ?He typically takes a nap in the afternoon, as a pattern. ? ?Elevated HCT ?Mild elevation with RBC 5.82 ?Prior rangers have been normal range for past 4 years+ ? ?Elevated TSH ?TSH 4.53, previous 3.8 normal, and prior highest was 3 years ago at 5.0 ? ?Health Maintenance: ?Colonoscopy completed 6/20214, next due 10 years, 2024. ? ?PCV-13 x 2 completed 2018 and 2020. ?He will check records on this, says he had the PCV 20 new vaccine last  year. ? ?Depression screen [facility] 2/9 05/30/2021 03/05/2020 02/02/2019  ?Decreased Interest 0 0 0  ?Down, Depressed, Hopeless 0 0 0  ?PHQ - 2 Score 0 0 0  ?Altered sleeping - 1 -  ?Tired, decreased energy - 0 -  ?Change in appetite - 0 -  ?Feeling bad or failure about yourself  - 0 -  ?Trouble concentrating - 0 -  ?Moving slowly or fidgety/restless - 0 -  ?Suicidal thoughts - 0 -  ?PHQ-9 Score - 1 -  ?Difficult doing work/chores - Not difficult at all -  ? ? ?Past Medical History:  ?Diagnosis Date  ? Allergy   ? Bone spur   ? Elevated PSA   ? Hernia 03/16/2012  ? Hyperlipidemia   ? Hypertension   ? ?Past Surgical History:  ?Procedure Laterality Date  ? BACK SURGERY  2008  ? KNEE ARTHROSCOPY Left   ? KNEE SURGERY Left [id number]  ? TONSILLECTOMY AND ADENOIDECTOMY    ? ?Social History  ? ?Socioeconomic History  ? Marital status: Married  ?  Spouse name: Not on file  ? Number of children: Not on file  ? Years of education: Not on file  ? Highest education level: Not on file  ?Occupational History  ? Not on file  ?Tobacco Use  ? Smoking status: Never  ? Smokeless tobacco: Never  ?Vaping Use  ? Vaping Use: Never used  ?Substance and Sexual Activity  ? Alcohol use: Yes  ?  Comment: rarely  ?  Drug use: No  ? Sexual activity: Not on file  ?Other Topics Concern  ? Not on file  ?Social History Narrative  ? Not on file  ? ?Social Determinants of Health  ? ?Financial Resource Strain: Low Risk   ? Difficulty of Paying Living Expenses: Not hard at all  ?Food Insecurity: No Food Insecurity  ? Worried About Charity fundraiser in the Last Year: Never true  ? Ran Out of Food in the Last Year: Never true  ?Transportation Needs: No Transportation Needs  ? Lack of Transportation (Medical): No  ? Lack of Transportation (Non-Medical): No  ?Physical Activity: Sufficiently Active  ? Days of Exercise per Week: 5 days  ? Minutes of Exercise per Session: 70 min  ?Stress: No Stress Concern Present  ? Feeling of Stress : Not at all  ?Social  Connections: Moderately Isolated  ? Frequency of Communication with Friends and Family: More than three times a week  ? Frequency of Social Gatherings with Friends and Family: Once a week  ? Attends Religious Services: Never  ? Active Member of Clubs or Organizations: No  ? Attends Archivist Meetings: Never  ? Marital Status: Married  ?Intimate Partner Violence: Not At Risk  ? Fear of Current or Ex-Partner: No  ? Emotionally Abused: No  ? Physically Abused: No  ? Sexually Abused: No  ? ?Family History  ?Problem Relation Age of Onset  ? Peptic Ulcer Disease Mother   ? Cancer Mother   ? Heart attack Father   ? Dementia Maternal Grandmother   ? Diabetes Maternal Grandfather   ? Heart attack Paternal Grandmother   ? CVA Paternal Grandmother   ? Alcohol abuse Paternal Grandfather   ? Healthy Daughter   ? ?Current Outpatient Medications on File Prior to Visit  ?Medication Sig  ? aspirin 81 MG tablet Take 81 mg by mouth daily.  ? diphenhydrAMINE HCl, Sleep, 50 MG CAPS Take 1 capsule by mouth at bedtime as needed.  ? lisinopril-hydrochlorothiazide (ZESTORETIC) 10-12.5 MG tablet Take 1 tablet by mouth daily.  ? loratadine (CLARITIN) 10 MG tablet Take 10 mg by mouth daily as needed for allergies.  ? lovastatin (MEVACOR) 40 MG tablet Take 1 tablet (40 mg total) by mouth at bedtime.  ? metoprolol succinate (TOPROL-XL) 25 MG 24 hr tablet Take 1 tablet (25 mg total) by mouth daily.  ? ?No current facility-administered medications on file prior to visit.  ? ? ?Review of Systems  ?Constitutional:  Negative for activity change, appetite change, chills, diaphoresis, fatigue and fever.  ?HENT:  Negative for congestion and hearing loss.   ?Eyes:  Negative for visual disturbance.  ?Respiratory:  Negative for cough, chest tightness, shortness of breath and wheezing.   ?Cardiovascular:  Negative for chest pain, palpitations and leg swelling.  ?Gastrointestinal:  Negative for abdominal pain, constipation, diarrhea, nausea and  vomiting.  ?Genitourinary:  Negative for dysuria, frequency and hematuria.  ?Musculoskeletal:  Negative for arthralgias and neck pain.  ?Skin:  Negative for rash.  ?Neurological:  Negative for dizziness, weakness, light-headedness, numbness and headaches.  ?Hematological:  Negative for adenopathy.  ?Psychiatric/Behavioral:  Negative for behavioral problems, dysphoric mood and sleep disturbance.   ?Per HPI unless specifically indicated above ? ?   ?Objective:  ?  ?BP 130/88   Pulse 64   Ht '5\' 9"'$  (1.753 m)   Wt 181 lb (82.1 kg)   SpO2 100%   BMI 26.73 kg/m?   ?Wt Readings from Last 3 Encounters:  ?  05/30/21 181 lb (82.1 kg)  ?04/25/21 180 lb 9.6 oz (81.9 kg)  ?03/05/20 178 lb (80.7 kg)  ?  ?Physical Exam ?Vitals and nursing note reviewed.  ?Constitutional:   ?   General: He is not in acute distress. ?   Appearance: He is well-developed. He is not diaphoretic.  ?   Comments: Well-appearing, comfortable, cooperative  ?HENT:  ?   Head: Normocephalic and atraumatic.  ?Eyes:  ?   General:     ?   Right eye: No discharge.     ?   Left eye: No discharge.  ?   Conjunctiva/sclera: Conjunctivae normal.  ?   Pupils: Pupils are equal, round, and reactive to light.  ?Neck:  ?   Thyroid: No thyromegaly.  ?   Vascular: No carotid bruit.  ?Cardiovascular:  ?   Rate and Rhythm: Normal rate and regular rhythm.  ?   Pulses: Normal pulses.  ?   Heart sounds: Normal heart sounds. No murmur heard. ?Pulmonary:  ?   Effort: Pulmonary effort is normal. No respiratory distress.  ?   Breath sounds: Normal breath sounds. No wheezing or rales.  ?Abdominal:  ?   General: Bowel sounds are normal. There is no distension.  ?   Palpations: Abdomen is soft. There is no mass.  ?   Tenderness: There is no abdominal tenderness.  ?Musculoskeletal:     ?   General: No tenderness. Normal range of motion.  ?   Cervical back: Normal range of motion and neck supple.  ?   Right lower leg: No edema.  ?   Left lower leg: No edema.  ?   Comments: Upper / Lower  Extremities: ?- Normal muscle tone, strength bilateral upper extremities 5/5, lower extremities 5/5  ?Lymphadenopathy:  ?   Cervical: No cervical adenopathy.  ?Skin: ?   General: Skin is warm and dry.  ?   Findings: No erythema or r

## 2021-05-30 NOTE — Assessment & Plan Note (Signed)
BP improved today  ?Home readings reviewed  ? ?He may follow up with me on MyChart to review BP readings - we discussed dosing and will be TBD on if he does better w/ BB metoprolol XL only or lisinopril-HCTZ, then we can adjust doses accordingly. ? ?For now keep both meds as discussed per AVS trial to figure out which ones benefit his BP most. Suspect he does not need all 3 medications for BP management alone, we can adjust. ?

## 2021-05-30 NOTE — Patient Instructions (Signed)
[name] , ?Thank you for taking time to come for your Medicare Wellness Visit. I appreciate your ongoing commitment to your health goals. Please review the following plan we discussed and let me know if I can assist you in the future.  ? ?Screening recommendations/referrals: ?Colonoscopy: 09/07/12 ?Recommended yearly ophthalmology/optometry visit for glaucoma screening and checkup ?Recommended yearly dental visit for hygiene and checkup ? ?Vaccinations: ?Influenza vaccine: 12/25/20 ?Pneumococcal vaccine: 11/03/18 ?Tdap vaccine: 02/25/17 ?Shingles vaccine: Shingrix 10/30/17, 11/10/18   ?Covid-19: 05/09/19, 05/30/19, 01/04/20, 01/02/21 ? ?Advanced directives: yes, requested copy ? ?Conditions/risks identified: none ? ?Next appointment: Follow up in one year for your annual wellness visit. 06/05/22 @ 1:50pm in person ? ?Preventive Care 70 Years and Older, Male ?Preventive care refers to lifestyle choices and visits with your health care provider that can promote health and wellness. ?What does preventive care include? ?A yearly physical exam. This is also called an annual well check. ?Dental exams once or twice a year. ?Routine eye exams. Ask your health care provider how often you should have your eyes checked. ?Personal lifestyle choices, including: ?Daily care of your teeth and gums. ?Regular physical activity. ?Eating a healthy diet. ?Avoiding tobacco and drug use. ?Limiting alcohol use. ?Practicing safe sex. ?Taking low doses of aspirin every day. ?Taking vitamin and mineral supplements as recommended by your health care provider. ?What happens during an annual well check? ?The services and screenings done by your health care provider during your annual well check will depend on your age, overall health, lifestyle risk factors, and family history of disease. ?Counseling  ?Your health care provider may ask you questions about your: ?Alcohol use. ?Tobacco use. ?Drug use. ?Emotional well-being. ?Home and relationship  well-being. ?Sexual activity. ?Eating habits. ?History of falls. ?Memory and ability to understand (cognition). ?Work and work Statistician. ?Screening  ?You may have the following tests or measurements: ?Height, weight, and BMI. ?Blood pressure. ?Lipid and cholesterol levels. These may be checked every 5 years, or more frequently if you are over 21 years old. ?Skin check. ?Lung cancer screening. You may have this screening every year starting at age 25 if you have a 30-pack-year history of smoking and currently smoke or have quit within the past 15 years. ?Fecal occult blood test (FOBT) of the stool. You may have this test every year starting at age 11. ?Flexible sigmoidoscopy or colonoscopy. You may have a sigmoidoscopy every 5 years or a colonoscopy every 10 years starting at age 70. ?Prostate cancer screening. Recommendations will vary depending on your family history and other risks. ?Hepatitis C blood test. ?Hepatitis B blood test. ?Sexually transmitted disease (STD) testing. ?Diabetes screening. This is done by checking your blood sugar (glucose) after you have not eaten for a while (fasting). You may have this done every 1-3 years. ?Abdominal aortic aneurysm (AAA) screening. You may need this if you are a current or former smoker. ?Osteoporosis. You may be screened starting at age 70 if you are at high risk. ?Talk with your health care provider about your test results, treatment options, and if necessary, the need for more tests. ?Vaccines  ?Your health care provider may recommend certain vaccines, such as: ?Influenza vaccine. This is recommended every year. ?Tetanus, diphtheria, and acellular pertussis (Tdap, Td) vaccine. You may need a Td booster every 10 years. ?Zoster vaccine. You may need this after age 9. ?Pneumococcal 13-valent conjugate (PCV13) vaccine. One dose is recommended after age 21. ?Pneumococcal polysaccharide (PPSV23) vaccine. One dose is recommended after age 59. ?Talk  to your health care  provider about which screenings and vaccines you need and how often you need them. ?This information is not intended to replace advice given to you by your health care provider. Make sure you discuss any questions you have with your health care provider. ?Document Released: 03/29/2015 Document Revised: 11/20/2015 Document Reviewed: 01/01/2015 ?Elsevier Interactive Patient Education ? 2017 [place]. ? ?Fall Prevention in the Home ?Falls can cause injuries. They can happen to people of all ages. There are many things you can do to make your home safe and to help prevent falls. ?What can I do on the outside of my home? ?Regularly fix the edges of walkways and driveways and fix any cracks. ?Remove anything that might make you trip as you walk through a door, such as a raised step or threshold. ?Trim any bushes or trees on the path to your home. ?Use bright outdoor lighting. ?Clear any walking paths of anything that might make someone trip, such as rocks or tools. ?Regularly check to see if handrails are loose or broken. Make sure that both sides of any steps have handrails. ?Any raised decks and porches should have guardrails on the edges. ?Have any leaves, snow, or ice cleared regularly. ?Use sand or salt on walking paths during winter. ?Clean up any spills in your garage right away. This includes oil or grease spills. ?What can I do in the bathroom? ?Use night lights. ?Install grab bars by the toilet and in the tub and shower. Do not use towel bars as grab bars. ?Use non-skid mats or decals in the tub or shower. ?If you need to sit down in the shower, use a plastic, non-slip stool. ?Keep the floor dry. Clean up any water that spills on the floor as soon as it happens. ?Remove soap buildup in the tub or shower regularly. ?Attach bath mats securely with double-sided non-slip rug tape. ?Do not have throw rugs and other things on the floor that can make you trip. ?What can I do in the bedroom? ?Use night lights. ?Make  sure that you have a light by your bed that is easy to reach. ?Do not use any sheets or blankets that are too big for your bed. They should not hang down onto the floor. ?Have a firm chair that has side arms. You can use this for support while you get dressed. ?Do not have throw rugs and other things on the floor that can make you trip. ?What can I do in the kitchen? ?Clean up any spills right away. ?Avoid walking on wet floors. ?Keep items that you use a lot in easy-to-reach places. ?If you need to reach something above you, use a strong step stool that has a grab bar. ?Keep electrical cords out of the way. ?Do not use floor polish or wax that makes floors slippery. If you must use wax, use non-skid floor wax. ?Do not have throw rugs and other things on the floor that can make you trip. ?What can I do with my stairs? ?Do not leave any items on the stairs. ?Make sure that there are handrails on both sides of the stairs and use them. Fix handrails that are broken or loose. Make sure that handrails are as long as the stairways. ?Check any carpeting to make sure that it is firmly attached to the stairs. Fix any carpet that is loose or worn. ?Avoid having throw rugs at the top or bottom of the stairs. If you do have throw rugs,  attach them to the floor with carpet tape. ?Make sure that you have a light switch at the top of the stairs and the bottom of the stairs. If you do not have them, ask someone to add them for you. ?What else can I do to help prevent falls? ?Wear shoes that: ?Do not have high heels. ?Have rubber bottoms. ?Are comfortable and fit you well. ?Are closed at the toe. Do not wear sandals. ?If you use a stepladder: ?Make sure that it is fully opened. Do not climb a closed stepladder. ?Make sure that both sides of the stepladder are locked into place. ?Ask someone to hold it for you, if possible. ?Clearly mark and make sure that you can see: ?Any grab bars or handrails. ?First and last steps. ?Where the  edge of each step is. ?Use tools that help you move around (mobility aids) if they are needed. These include: ?Canes. ?Walkers. ?Scooters. ?Crutches. ?Turn on the lights when you go into a dark area. Rep

## 2021-05-30 NOTE — Assessment & Plan Note (Signed)
BPH ?Chronic with elevated PSA 4-6 range ?Trial on Saw Palmetto for BPH symptoms ?PSA similar to prior range ?Will repeat in 6 months to trend it better ?No prior biopsy or other imaging, consider Urology in future if still abnormal ?

## 2021-05-30 NOTE — Patient Instructions (Addendum)
Thank you for coming to the office today. ? ?7-10 days Metoprolol ONLY ? ?Then trial on 7-10 days for Lisinopril-HCTZ only ? ?Goal is < 140 / 90 ? ?Let me know how it goes and we can sort it out and determine which med is best and we can re order / adjust dose. ? ?Double check status of Pneumonia. ? ?DUE for NON FASTING BLOOD WORK  ? ? ?Please schedule a Follow-up Appointment to: Return in about 6 months (around 11/30/2021) for 6 month lab then return 1 week later Follow-up Lab result PSA, HTN. ? ?If you have any other questions or concerns, please feel free to call the office or send a message through [place]. You may also schedule an earlier appointment if necessary. ? ?Additionally, you may be receiving a survey about your experience at our office within a few days to 1 week by e-mail or mail. We value your feedback. ? ?Nobie Putnam, DO ?[place] ?

## 2021-05-30 NOTE — Assessment & Plan Note (Signed)
Mild elevated LDL ?Last lipid panel 05/2021 ?The 10-year ASCVD risk score (Arnett DK, et al., 2019) is: 19.2% ? ?Plan: ?1. Continue current meds - Lovastatin '40mg'$  ?2. Encourage improved lifestyle - low carb/cholesterol, reduce portion size, continue improving regular exercise ?

## 2021-06-11 ENCOUNTER — Encounter: Payer: Self-pay | Admitting: Family Medicine

## 2021-06-12 ENCOUNTER — Encounter: Payer: Self-pay | Admitting: Family Medicine

## 2021-06-15 ENCOUNTER — Encounter: Payer: Self-pay | Admitting: Family Medicine

## 2021-06-24 ENCOUNTER — Ambulatory Visit: Payer: Medicare PPO | Admitting: Family Medicine

## 2021-06-25 ENCOUNTER — Other Ambulatory Visit: Payer: Self-pay | Admitting: Physician Assistant

## 2021-06-25 DIAGNOSIS — E7800 Pure hypercholesterolemia, unspecified: Secondary | ICD-10-CM

## 2021-06-25 NOTE — Telephone Encounter (Signed)
Pt is calling back to follow up and wanting to make sure this refill goes through Dr. Parks Ranger as he is new PCP.  ?

## 2021-06-25 NOTE — Telephone Encounter (Signed)
Requested Prescriptions  ?Pending Prescriptions Disp Refills  ?? lovastatin (MEVACOR) 40 MG tablet [Pharmacy Med Name: LOVASTATIN 40 MG TABLET] 90 tablet 0  ?  Sig: TAKE 1 TABLET BY MOUTH EVERYDAY AT BEDTIME  ?  ? Cardiovascular:  Antilipid - Statins 2 Failed - 06/25/2021 11:07 AM  ?  ?  Failed - Lipid Panel in normal range within the last 12 months  ?  Cholesterol, Total  ?Date Value Ref Range Status  ?03/05/2020 168 100 - 199 mg/dL Final  ? ?Cholesterol  ?Date Value Ref Range Status  ?05/26/2021 185 <200 mg/dL Final  ? ?LDL Cholesterol (Calc)  ?Date Value Ref Range Status  ?05/26/2021 110 (H) mg/dL (calc) Final  ?  Comment:  ?  Reference range: <100 ?Marland Kitchen ?Desirable range <100 mg/dL for primary prevention;   ?<70 mg/dL for patients with CHD or diabetic patients  ?with > or = 2 CHD risk factors. ?. ?LDL-C is now calculated using the Martin-Hopkins  ?calculation, which is a validated novel method providing  ?better accuracy than the Friedewald equation in the  ?estimation of LDL-C.  ?Cresenciano Genre et al. Annamaria Helling. 1165;790(38): 2061-2068  ?(http://education.[facility].com/faq/FAQ164) ?  ? ?HDL  ?Date Value Ref Range Status  ?05/26/2021 50 > OR = 40 mg/dL Final  ?03/05/2020 50 >39 mg/dL Final  ? ?Triglycerides  ?Date Value Ref Range Status  ?05/26/2021 140 <150 mg/dL Final  ? ?  ?  ?  Passed - Cr in normal range and within 360 days  ?  Creat  ?Date Value Ref Range Status  ?05/26/2021 1.05 0.70 - 1.35 mg/dL Final  ?   ?  ?  Passed - Patient is not pregnant  ?  ?  Passed - Valid encounter within last 12 months  ?  Recent Outpatient Visits   ?      ? 3 weeks ago Annual physical exam  ? [place], DO  ? 2 months ago Benign prostatic hyperplasia with nocturia  ? [place], DO  ? 3 years ago Blackout spell  ? [place], PA-C  ? 3 years ago Essential (primary) hypertension  ? [place], PA-C  ? 4 years ago Acute pharyngitis, unspecified etiology  ? [place], PA-C  ?  ?  ? ?  ?  ?  ? ? ?

## 2021-08-28 ENCOUNTER — Encounter: Payer: Self-pay | Admitting: Family Medicine

## 2021-09-27 ENCOUNTER — Other Ambulatory Visit: Payer: Self-pay | Admitting: Family Medicine

## 2021-09-27 DIAGNOSIS — E7800 Pure hypercholesterolemia, unspecified: Secondary | ICD-10-CM

## 2021-09-29 NOTE — Telephone Encounter (Signed)
Requested Prescriptions  Pending Prescriptions Disp Refills  . lovastatin (MEVACOR) 40 MG tablet [Pharmacy Med Name: LOVASTATIN 40 MG TABLET] 90 tablet 2    Sig: TAKE 1 TABLET BY MOUTH EVERYDAY AT BEDTIME     Cardiovascular:  Antilipid - Statins 2 Failed - 09/27/2021 10:18 AM      Failed - Lipid Panel in normal range within the last 12 months    Cholesterol, Total  Date Value Ref Range Status  03/05/2020 168 100 - 199 mg/dL Final   Cholesterol  Date Value Ref Range Status  05/26/2021 185 <200 mg/dL Final   LDL Cholesterol (Calc)  Date Value Ref Range Status  05/26/2021 110 (H) mg/dL (calc) Final    Comment:    Reference range: <100 . Desirable range <100 mg/dL for primary prevention;   <70 mg/dL for patients with CHD or diabetic patients  with > or = 2 CHD risk factors. Marland Kitchen LDL-C is now calculated using the Martin-Hopkins  calculation, which is a validated novel method providing  better accuracy than the Friedewald equation in the  estimation of LDL-C.  Cresenciano Genre et al. Annamaria Helling. 1275;170(01): 2061-2068  (http://education.[facility].com/faq/FAQ164)    HDL  Date Value Ref Range Status  05/26/2021 50 > OR = 40 mg/dL Final  03/05/2020 50 >39 mg/dL Final   Triglycerides  Date Value Ref Range Status  05/26/2021 140 <150 mg/dL Final         Passed - Cr in normal range and within 360 days    Creat  Date Value Ref Range Status  05/26/2021 1.05 0.70 - 1.35 mg/dL Final         Passed - Patient is not pregnant      Passed - Valid encounter within last 12 months    Recent Outpatient Visits          4 months ago Annual physical exam   [place], DO   5 months ago Benign prostatic hyperplasia with nocturia   [place], Devonne [place]ought[place], DO   3 years ago Blackout spell   [facility], Vickki Muff, PA-C   3 years ago Essential (primary) hypertension   [facility], Vickki Muff, PA-C   4 years ago Acute pharyngitis, unspecified etiology   [facility], Vickki Muff, [place]

## 2021-10-29 DIAGNOSIS — M13842 Other specified arthritis, left hand: Secondary | ICD-10-CM | POA: Diagnosis not present

## 2021-10-29 DIAGNOSIS — M79645 Pain in left finger(s): Secondary | ICD-10-CM | POA: Insufficient documentation

## 2021-11-28 ENCOUNTER — Other Ambulatory Visit: Payer: Self-pay

## 2021-11-28 DIAGNOSIS — R972 Elevated prostate specific antigen [PSA]: Secondary | ICD-10-CM

## 2021-11-28 DIAGNOSIS — N401 Enlarged prostate with lower urinary tract symptoms: Secondary | ICD-10-CM

## 2021-12-01 ENCOUNTER — Other Ambulatory Visit: Payer: Medicare PPO

## 2021-12-01 DIAGNOSIS — R351 Nocturia: Secondary | ICD-10-CM | POA: Diagnosis not present

## 2021-12-01 DIAGNOSIS — N401 Enlarged prostate with lower urinary tract symptoms: Secondary | ICD-10-CM | POA: Diagnosis not present

## 2021-12-01 DIAGNOSIS — R972 Elevated prostate specific antigen [PSA]: Secondary | ICD-10-CM | POA: Diagnosis not present

## 2021-12-02 LAB — PSA: PSA: 4.35 ng/mL — ABNORMAL HIGH (ref <=4.00)

## 2021-12-05 ENCOUNTER — Encounter: Payer: Self-pay | Admitting: Family Medicine

## 2021-12-05 DIAGNOSIS — N401 Enlarged prostate with lower urinary tract symptoms: Secondary | ICD-10-CM

## 2021-12-05 DIAGNOSIS — R7309 Other abnormal glucose: Secondary | ICD-10-CM

## 2021-12-05 DIAGNOSIS — I10 Essential (primary) hypertension: Secondary | ICD-10-CM

## 2021-12-05 DIAGNOSIS — E7800 Pure hypercholesterolemia, unspecified: Secondary | ICD-10-CM

## 2021-12-05 DIAGNOSIS — Z0000 Encounter for general adult medical examination without abnormal findings: Secondary | ICD-10-CM

## 2021-12-05 DIAGNOSIS — R972 Elevated prostate specific antigen [PSA]: Secondary | ICD-10-CM

## 2022-03-26 ENCOUNTER — Other Ambulatory Visit: Payer: Self-pay | Admitting: Family Medicine

## 2022-03-26 DIAGNOSIS — I10 Essential (primary) hypertension: Secondary | ICD-10-CM

## 2022-03-26 NOTE — Telephone Encounter (Signed)
Requested medication (s) are due for refill today: Lisinopril - HCTZ yes, Metoprolol 04/15/22  Requested medication (s) are on the active medication list: Yes  Last refill: Lisinopril-HCTZ  01/09/21  #90 3 refills     Metoprolol  04/03/21  #90  3 refills  Future visit scheduled Yes  Notes to clinic:Historical providers, please review. Thank you.  Requested Prescriptions  Pending Prescriptions Disp Refills   lisinopril-hydrochlorothiazide (ZESTORETIC) 10-12.5 MG tablet 90 tablet 3    Sig: Take 1 tablet by mouth daily.     Cardiovascular:  ACEI + Diuretic Combos Failed - 03/26/2022 11:56 AM      Failed - Na in normal range and within 180 days    Sodium  Date Value Ref Range Status  05/26/2021 141 135 - 146 mmol/L Final  03/05/2020 139 134 - 144 mmol/L Final         Failed - K in normal range and within 180 days    Potassium  Date Value Ref Range Status  05/26/2021 4.0 3.5 - 5.3 mmol/L Final         Failed - Cr in normal range and within 180 days    Creat  Date Value Ref Range Status  05/26/2021 1.05 0.70 - 1.35 mg/dL Final         Failed - eGFR is 30 or above and within 180 days    GFR calc Af Amer  Date Value Ref Range Status  03/05/2020 67 >59 mL/min/1.73 Final    Comment:    **In accordance with recommendations from the NKF-ASN Task force,**   [facility] is in the process of updating its eGFR calculation to the   2021 CKD-EPI creatinine equation that estimates kidney function   without a race variable.    GFR calc non Af Amer  Date Value Ref Range Status  03/05/2020 58 (L) >59 mL/min/1.73 Final   eGFR  Date Value Ref Range Status  05/26/2021 77 > OR = 60 mL/min/1.24m Final    Comment:    The eGFR is based on the CKD-EPI 2021 equation. To calculate  the new eGFR from a previous Creatinine or Cystatin C result, go to https://www.kidney.org/professionals/ kdoqi/gfr%5Fcalculator          Failed - Valid encounter within last 6 months    Recent Outpatient Visits            10 months ago [facility]   11 months ago Benign prostatic hyperplasia with nocturia   [place] [facility]   3 years ago Blackout spell   [facility] PA-C   4 years ago Essential (primary) hypertension   [facility]   5 years ago Acute pharyngitis, unspecified etiology   [facility] [place] [place] is not [place]      Passed - Last BP in normal range    BP Readings from Last 1 Encounters:  05/30/21 130/88          metoprolol succinate (TOPROL-XL) 25 MG 24 hr tablet 90 tablet 3    Sig: Take 1 tablet (25 mg total) by mouth daily.     Cardiovascular:  Beta Blockers Failed - 03/26/2022 11:56 AM      Failed - Valid encounter within last 6 months  Recent Outpatient Visits           10 months ago Annual physical exam   [facility] Olin Hauser, DO   11 months ago Benign prostatic hyperplasia with nocturia   [place], Devonne Doughty, DO   3 years ago Blackout spell   [facility], Vickki Muff, PA-C   4 years ago Essential (primary) hypertension   [employer], Vickki Muff, PA-C   5 years ago Acute pharyngitis, unspecified etiology   [place], PA-C              Passed - Last BP in normal range    BP Readings from Last 1 Encounters:  05/30/21 130/88         Passed - Last Heart Rate in normal range    Pulse Readings from Last 1 Encounters:  05/30/21 64

## 2022-03-26 NOTE — Telephone Encounter (Signed)
Medication Refill - Medication: lisinopril-hydrochlorothiazide (ZESTORETIC) 10-12.5 MG tablet [[id number]]  metoprolol succinate (TOPROL-XL) 25 MG 24 hr tablet [[id number]] Pt leaves to go out of town tomorrow /and will be out of town when these last refills expire / please advise    Has the patient contacted their pharmacy? Yes.   (Agent: If no, request that the patient contact the pharmacy for the refill. If patient does not wish to contact the pharmacy document the reason why and proceed with request.) (Agent: If yes, when and what did the pharmacy advise?) request sent to office several times with no response   Preferred Pharmacy (with phone number or street name): [id number] [place] been seen for an appointment in the last year OR does the patient have an upcoming appointment? Yes.    Agent: Please be advised that RX refills may take up to 3 business days. We ask that you follow-up with your pharmacy.

## 2022-03-27 ENCOUNTER — Other Ambulatory Visit: Payer: Self-pay | Admitting: Family Medicine

## 2022-03-27 DIAGNOSIS — I10 Essential (primary) hypertension: Secondary | ICD-10-CM

## 2022-03-27 MED ORDER — METOPROLOL SUCCINATE ER 25 MG PO TB24: 25.0000 mg | ORAL_TABLET | Freq: Every day | ORAL | 3 refills | Status: DC

## 2022-03-27 MED ORDER — LISINOPRIL-HYDROCHLOROTHIAZIDE 10-12.5 MG PO TABS: 1.0000 | ORAL_TABLET | Freq: Every day | ORAL | 3 refills | Status: DC

## 2022-03-27 NOTE — Telephone Encounter (Signed)
Last RF 03/27/2022 #90 3 RF  Requested Prescriptions  Refused Prescriptions Disp Refills   metoprolol succinate (TOPROL-XL) 25 MG 24 hr tablet [Pharmacy Med Name: METOPROLOL SUCC ER 25 MG TAB] 90 tablet 3    Sig: TAKE 1 TABLET (25 MG TOTAL) BY MOUTH DAILY.     Cardiovascular:  Beta Blockers Failed - 03/27/2022  8:17 AM      Failed - Valid encounter within last 6 months    Recent Outpatient Visits           10 months ago Annual physical exam   [facility] Olin Hauser, DO   11 months ago Benign prostatic hyperplasia with nocturia   [place], Devonne Doughty, DO   3 years ago Blackout spell   [employer], Vickki Muff, PA-C   4 years ago Essential (primary) hypertension   [employer], Vickki Muff, PA-C   5 years ago Acute pharyngitis, unspecified etiology   [place], PA-C              Passed - Last BP in normal range    BP Readings from Last 1 Encounters:  05/30/21 130/88         Passed - Last Heart Rate in normal range    Pulse Readings from Last 1 Encounters:  05/30/21 64

## 2022-04-15 DIAGNOSIS — H35371 Puckering of macula, right eye: Secondary | ICD-10-CM | POA: Diagnosis not present

## 2022-04-15 DIAGNOSIS — Z0100 Encounter for examination of eyes and vision without abnormal findings: Secondary | ICD-10-CM | POA: Diagnosis not present

## 2022-04-15 DIAGNOSIS — H2513 Age-related nuclear cataract, bilateral: Secondary | ICD-10-CM | POA: Diagnosis not present

## 2022-04-15 DIAGNOSIS — H43813 Vitreous degeneration, bilateral: Secondary | ICD-10-CM | POA: Diagnosis not present

## 2022-04-15 DIAGNOSIS — H35372 Puckering of macula, left eye: Secondary | ICD-10-CM | POA: Diagnosis not present

## 2022-04-15 DIAGNOSIS — H35373 Puckering of macula, bilateral: Secondary | ICD-10-CM | POA: Diagnosis not present

## 2022-05-13 ENCOUNTER — Other Ambulatory Visit: Payer: Self-pay | Admitting: Family Medicine

## 2022-05-13 ENCOUNTER — Ambulatory Visit: Payer: Medicare PPO | Admitting: Family Medicine

## 2022-05-13 ENCOUNTER — Encounter: Payer: Self-pay | Admitting: Family Medicine

## 2022-05-13 VITALS — BP 130/84 | HR 61 | Ht 69.0 in | Wt 185.0 lb

## 2022-05-13 DIAGNOSIS — E7800 Pure hypercholesterolemia, unspecified: Secondary | ICD-10-CM

## 2022-05-13 DIAGNOSIS — R972 Elevated prostate specific antigen [PSA]: Secondary | ICD-10-CM

## 2022-05-13 DIAGNOSIS — R351 Nocturia: Secondary | ICD-10-CM | POA: Diagnosis not present

## 2022-05-13 DIAGNOSIS — R7309 Other abnormal glucose: Secondary | ICD-10-CM

## 2022-05-13 DIAGNOSIS — N401 Enlarged prostate with lower urinary tract symptoms: Secondary | ICD-10-CM | POA: Diagnosis not present

## 2022-05-13 DIAGNOSIS — I10 Essential (primary) hypertension: Secondary | ICD-10-CM

## 2022-05-13 DIAGNOSIS — Z0000 Encounter for general adult medical examination without abnormal findings: Secondary | ICD-10-CM

## 2022-05-13 NOTE — Progress Notes (Signed)
Subjective:    Patient ID: Warren Jackson, male    DOB: 22-Sep-1950, 72 y.o.   MRN: [id number]  Warren Jackson is a 72 y.o. male presenting on 05/13/2022 for Hypertension   HPI   Elevated PSA BPH Prior diagnosis since 1990 Last PSA 4.35 (11/2021) Previously on [facility], limited results, discontinued No prior prostate biopsy. He does well during day. Bathroom before going for walk Not having significant problems during the day Seems to only have prostate BPH symptoms at night He admits some difficulty with "long covid dreams" he has very odd or visual dreams If he wakes up will go to void    CHRONIC HTN: Tried off these meds previously at once. Needed BB Home BP readings none recently. Current Meds - Lisinopril-HCTZ 10-12.'5mg'$  every other day, Metoprolol XL '25mg'$  daily   Reports good compliance. Tolerating well, w/o complaints. Walking daily for exercise Admits postural dizziness  Denies CP, dyspnea, HA, edema, dizziness / lightheadedness   Health Maintenance: Refer to Colonoscopy next visit     05/13/2022    9:34 AM 05/30/2021    2:04 PM 03/05/2020    1:44 PM  Depression screen PHQ 2/9  Decreased Interest 0 0 0  Down, Depressed, Hopeless 0 0 0  PHQ - 2 Score 0 0 0  Altered sleeping 0  1  Tired, decreased energy 0  0  Change in appetite 0  0  Feeling bad or failure about yourself  0  0  Trouble concentrating 0  0  Moving slowly or fidgety/restless 0  0  Suicidal thoughts 0  0  PHQ-9 Score 0  1  Difficult doing work/chores Not difficult at all  Not difficult at all    Social History   Tobacco Use   Smoking status: Never   Smokeless tobacco: Never  Vaping Use   Vaping Use: Never used  Substance Use Topics   Alcohol use: Yes    Comment: rarely   Drug use: No    Review of Systems Per HPI unless specifically indicated above     Objective:    BP 130/84   Pulse 61   Ht '5\' 9"'$  (1.753 m)   Wt 185 lb (83.9 kg)   SpO2 99%   BMI 27.32 kg/m   Wt  Readings from Last 3 Encounters:  05/13/22 185 lb (83.9 kg)  05/30/21 181 lb (82.1 kg)  04/25/21 180 lb 9.6 oz (81.9 kg)    Physical Exam Vitals and nursing note reviewed.  Constitutional:      General: He is not in acute distress.    Appearance: Normal appearance. He is well-developed. He is not diaphoretic.     Comments: Well-appearing, comfortable, cooperative  HENT:     Head: Normocephalic and atraumatic.  Eyes:     General:        Right eye: No discharge.        Left eye: No discharge.     Conjunctiva/sclera: Conjunctivae normal.  Cardiovascular:     Rate and Rhythm: Normal rate.  Pulmonary:     Effort: Pulmonary effort is normal.  Skin:    General: Skin is warm and dry.     Findings: No erythema or rash.  Neurological:     Mental Status: He is alert and oriented to person, place, and time.  Psychiatric:        Mood and Affect: Mood normal.        Behavior: Behavior normal.  Thought Content: Thought content normal.     Comments: Well groomed, good eye contact, normal speech and thoughts       Results for orders placed or performed in visit on 11/28/21  PSA  Result Value Ref Range   PSA 4.35 (H) < OR = 4.00 ng/mL      Assessment & Plan:   Problem List Items Addressed This Visit     Benign prostatic hyperplasia with nocturia - Primary   Other Visit Diagnoses     Elevated PSA, less than 10 ng/ml           Elevated PSA 4-6 range over past several years Last few labs stable but still >4 Known BPH, failed herbal saw palmetto Limited daytime LUTS, mostly nocturia Discussed options Recommend keep up with PSA reconsider Urologist  Labs in about 3 weeks, follow up as scheduled  If PSA is still rising, we can pursue discussion on Urology  Also reconsider adjusting the BP med vs new rx Prostate symptoms.  Tamsulosin (flomax), other one would be Doxazosin  No orders of the defined types were placed in this encounter.    Follow up plan: Return  in about 23 days (around 06/05/2022) for Return Mon 3/18 at 930am for fasting lab only, then Friday 3/22 Annual Physical.  Future labs ordered for 06/01/22    Nobie Putnam, [facility] 05/13/2022, 9:48 AM

## 2022-05-13 NOTE — Patient Instructions (Addendum)
Thank you for coming to the office today.  We discussed PSA trend. Labs in about 3 weeks, follow up as scheduled  If PSA is still rising, we can pursue discussion on Urology  Also reconsider adjusting the BP med vs new rx Prostate symptoms.  Tamsulosin (flomax), other one would be Doxazosin   DUE for FASTING BLOOD WORK (no food or drink after midnight before the lab appointment, only water or coffee without cream/sugar on the morning of)  SCHEDULE "Lab Only" visit in the morning at the clinic for lab draw in 1  MONTH  - Make sure Lab Only appointment is at about 1 week before your next appointment, so that results will be available  For Lab Results, once available within 2-3 days of blood draw, you can can log in to MyChart online to view your results and a brief explanation. Also, we can discuss results at next follow-up visit.   Please schedule a Follow-up Appointment to: Return in about 23 days (around 06/05/2022) for Return Mon 3/18 at 930am for fasting lab only, then Friday 3/22 Annual Physical.  If you have any other questions or concerns, please feel free to call the office or send a message through [place]. You may also schedule an earlier appointment if necessary.  Additionally, you may be receiving a survey about your experience at our office within a few days to 1 week by e-mail or mail. We value your feedback.  Nobie Putnam, DO [place]

## 2022-05-29 ENCOUNTER — Other Ambulatory Visit: Payer: Self-pay

## 2022-05-29 DIAGNOSIS — E7800 Pure hypercholesterolemia, unspecified: Secondary | ICD-10-CM

## 2022-05-29 DIAGNOSIS — Z0000 Encounter for general adult medical examination without abnormal findings: Secondary | ICD-10-CM

## 2022-05-29 DIAGNOSIS — N401 Enlarged prostate with lower urinary tract symptoms: Secondary | ICD-10-CM

## 2022-05-29 DIAGNOSIS — R7309 Other abnormal glucose: Secondary | ICD-10-CM

## 2022-05-29 DIAGNOSIS — I10 Essential (primary) hypertension: Secondary | ICD-10-CM

## 2022-05-29 DIAGNOSIS — R972 Elevated prostate specific antigen [PSA]: Secondary | ICD-10-CM

## 2022-06-01 ENCOUNTER — Other Ambulatory Visit: Payer: Medicare PPO

## 2022-06-01 DIAGNOSIS — R7309 Other abnormal glucose: Secondary | ICD-10-CM | POA: Diagnosis not present

## 2022-06-01 DIAGNOSIS — I10 Essential (primary) hypertension: Secondary | ICD-10-CM | POA: Diagnosis not present

## 2022-06-01 DIAGNOSIS — E7800 Pure hypercholesterolemia, unspecified: Secondary | ICD-10-CM | POA: Diagnosis not present

## 2022-06-01 DIAGNOSIS — Z0000 Encounter for general adult medical examination without abnormal findings: Secondary | ICD-10-CM | POA: Diagnosis not present

## 2022-06-01 DIAGNOSIS — N401 Enlarged prostate with lower urinary tract symptoms: Secondary | ICD-10-CM | POA: Diagnosis not present

## 2022-06-01 DIAGNOSIS — R351 Nocturia: Secondary | ICD-10-CM | POA: Diagnosis not present

## 2022-06-01 DIAGNOSIS — R972 Elevated prostate specific antigen [PSA]: Secondary | ICD-10-CM | POA: Diagnosis not present

## 2022-06-02 LAB — COMPLETE METABOLIC PANEL WITH GFR
AG Ratio: 1.7 (calc) (ref 1.0–2.5)
ALT: 14 U/L (ref 9–46)
AST: 19 U/L (ref 10–35)
Albumin: 4.5 g/dL (ref 3.6–5.1)
Alkaline phosphatase (APISO): 59 U/L (ref 35–144)
BUN/Creatinine Ratio: 26 (calc) — ABNORMAL HIGH (ref 6–22)
BUN: 28 mg/dL — ABNORMAL HIGH (ref 7–25)
CO2: 30 mmol/L (ref 20–32)
Calcium: 9.7 mg/dL (ref 8.6–10.3)
Chloride: 104 mmol/L (ref 98–110)
Creat: 1.07 mg/dL (ref 0.70–1.28)
Globulin: 2.6 g/dL (calc) (ref 1.9–3.7)
Glucose, Bld: 100 mg/dL — ABNORMAL HIGH (ref 65–99)
Potassium: 4.3 mmol/L (ref 3.5–5.3)
Sodium: 141 mmol/L (ref 135–146)
Total Bilirubin: 0.8 mg/dL (ref 0.2–1.2)
Total Protein: 7.1 g/dL (ref 6.1–8.1)
eGFR: 75 mL/min/{1.73_m2} (ref >=60)

## 2022-06-02 LAB — PSA: PSA: 5.23 ng/mL — ABNORMAL HIGH (ref <=4.00)

## 2022-06-02 LAB — CBC WITH DIFFERENTIAL/PLATELET
Absolute Monocytes: 426 cells/uL (ref 200–950)
Basophils Absolute: 30 cells/uL (ref 0–200)
Basophils Relative: 0.5 %
Eosinophils Absolute: 288 cells/uL (ref 15–500)
Eosinophils Relative: 4.8 %
HCT: 49.8 % (ref 38.5–50.0)
Hemoglobin: 16.5 g/dL (ref 13.2–17.1)
Lymphs Abs: 1380 cells/uL (ref 850–3900)
MCH: 29 pg (ref 27.0–33.0)
MCHC: 33.1 g/dL (ref 32.0–36.0)
MCV: 87.7 fL (ref 80.0–100.0)
MPV: 10.1 fL (ref 7.5–12.5)
Monocytes Relative: 7.1 %
Neutro Abs: 3876 cells/uL (ref 1500–7800)
Neutrophils Relative %: 64.6 %
Platelets: 225 10*3/uL (ref 140–400)
RBC: 5.68 10*6/uL (ref 4.20–5.80)
RDW: 13.1 % (ref 11.0–15.0)
Total Lymphocyte: 23 %
WBC: 6 10*3/uL (ref 3.8–10.8)

## 2022-06-02 LAB — LIPID PANEL
Cholesterol: 160 mg/dL (ref <200)
HDL: 48 mg/dL (ref >=40)
LDL Cholesterol (Calc): 92 mg/dL (calc)
Non-HDL Cholesterol (Calc): 112 mg/dL (calc) (ref <130)
Total CHOL/HDL Ratio: 3.3 (calc) (ref <5.0)
Triglycerides: 103 mg/dL (ref <150)

## 2022-06-02 LAB — HEMOGLOBIN A1C
Hgb A1c MFr Bld: 5.7 % of total Hgb — ABNORMAL HIGH (ref <5.7)
Mean Plasma Glucose: 117 mg/dL
eAG (mmol/L): 6.5 mmol/L

## 2022-06-02 LAB — TSH: TSH: 2.77 mIU/L (ref 0.40–4.50)

## 2022-06-05 ENCOUNTER — Encounter: Payer: Self-pay | Admitting: Family Medicine

## 2022-06-05 ENCOUNTER — Ambulatory Visit (INDEPENDENT_AMBULATORY_CARE_PROVIDER_SITE_OTHER): Payer: Medicare PPO

## 2022-06-05 ENCOUNTER — Ambulatory Visit (INDEPENDENT_AMBULATORY_CARE_PROVIDER_SITE_OTHER): Payer: Medicare PPO | Admitting: Family Medicine

## 2022-06-05 ENCOUNTER — Other Ambulatory Visit: Payer: Self-pay | Admitting: Family Medicine

## 2022-06-05 VITALS — BP 120/80 | HR 61 | Ht 69.0 in | Wt 185.0 lb

## 2022-06-05 VITALS — BP 120/80 | Ht 69.0 in | Wt 185.8 lb

## 2022-06-05 DIAGNOSIS — E7800 Pure hypercholesterolemia, unspecified: Secondary | ICD-10-CM | POA: Diagnosis not present

## 2022-06-05 DIAGNOSIS — R7989 Other specified abnormal findings of blood chemistry: Secondary | ICD-10-CM

## 2022-06-05 DIAGNOSIS — Z0000 Encounter for general adult medical examination without abnormal findings: Secondary | ICD-10-CM

## 2022-06-05 DIAGNOSIS — N401 Enlarged prostate with lower urinary tract symptoms: Secondary | ICD-10-CM

## 2022-06-05 DIAGNOSIS — R972 Elevated prostate specific antigen [PSA]: Secondary | ICD-10-CM | POA: Diagnosis not present

## 2022-06-05 DIAGNOSIS — Z1211 Encounter for screening for malignant neoplasm of colon: Secondary | ICD-10-CM

## 2022-06-05 DIAGNOSIS — R351 Nocturia: Secondary | ICD-10-CM | POA: Diagnosis not present

## 2022-06-05 DIAGNOSIS — I10 Essential (primary) hypertension: Secondary | ICD-10-CM

## 2022-06-05 DIAGNOSIS — R7309 Other abnormal glucose: Secondary | ICD-10-CM | POA: Diagnosis not present

## 2022-06-05 NOTE — Progress Notes (Signed)
Subjective:   Warren Jackson is a 72 y.o. male who presents for Medicare Annual/Subsequent preventive examination.  Review of Systems     Cardiac Risk Factors include: advanced age (>37men, >31 women);male gender;hypertension     Objective:    Today's Vitals   06/05/22 1429 06/05/22 1430  BP: 120/80   Weight: 185 lb 12.8 oz (84.3 kg)   Height: 5\' 9"  (1.753 m)   PainSc:  2    Body mass index is 27.44 kg/m.     06/05/2022    2:37 PM 05/30/2021    2:07 PM  Advanced Directives  Does Patient Have a Medical Advance Directive? No Yes  Type of Corporate treasurer of [place];Living will  Does patient want to make changes to medical advance directive?  Yes (Inpatient - patient defers changing a medical advance directive and declines information at this time)  Copy of [place] in Chart?  No - copy requested  Would patient like information on creating a medical advance directive? No - Patient declined     Current Medications (verified) Outpatient Encounter Medications as of 06/05/2022  Medication Sig   aspirin 81 MG tablet Take 81 mg by mouth daily.   Cetirizine HCl (ZYRTEC ALLERGY PO)    diphenhydrAMINE HCl, Sleep, 50 MG CAPS Take 1 capsule by mouth at bedtime as needed.   lisinopril-hydrochlorothiazide (ZESTORETIC) 10-12.5 MG tablet Take 1 tablet by mouth daily.   lovastatin (MEVACOR) 40 MG tablet TAKE 1 TABLET BY MOUTH EVERYDAY AT BEDTIME   metoprolol succinate (TOPROL-XL) 25 MG 24 hr tablet Take 1 tablet (25 mg total) by mouth daily.   No facility-administered encounter medications on file as of 06/05/2022.    Allergies (verified) Latex, Other, and Penicillins   History: Past Medical History:  Diagnosis Date   Allergy    Bone spur    Elevated PSA    Hernia 03/16/2012   Hyperlipidemia    Hypertension    Past Surgical History:  Procedure Laterality Date   BACK SURGERY  2008   KNEE ARTHROSCOPY Left    KNEE SURGERY Left [id number]    TONSILLECTOMY AND ADENOIDECTOMY     Family History  Problem Relation Age of Onset   Peptic Ulcer Disease Mother    Cancer Mother    Heart attack Father    Dementia Maternal Grandmother    Diabetes Maternal Grandfather    Heart attack Paternal Grandmother    CVA Paternal Grandmother    Alcohol abuse Paternal Grandfather    Healthy Daughter    Social History   Socioeconomic History   Marital status: Married    Spouse name: Not on file   Number of children: Not on file   Years of education: Not on file   Highest education level: Not on file  Occupational History   Not on file  Tobacco Use   Smoking status: Never   Smokeless tobacco: Never  Vaping Use   Vaping Use: Never used  Substance and Sexual Activity   Alcohol use: Yes    Comment: rarely   Drug use: No   Sexual activity: Not on file  Other Topics Concern   Not on file  Social History Narrative   Not on file   Social Determinants of Health   Financial Resource Strain: Low Risk  (06/05/2022)   Overall Financial Resource Strain (CARDIA)    Difficulty of Paying Living Expenses: Not hard at all  Food Insecurity: No Food Insecurity (06/05/2022)  Hunger Vital Sign    Worried About Running Out of Food in the Last Year: Never true    Ran Out of Food in the Last Year: Never true  Transportation Needs: No Transportation Needs (06/05/2022)   PRAPARE - Hydrologist (Medical): No    Lack of Transportation (Non-Medical): No  Physical Activity: Sufficiently Active (06/05/2022)   Exercise Vital Sign    Days of Exercise per Week: 7 days    Minutes of Exercise per Session: 60 min  Stress: No Stress Concern Present (06/05/2022)   [place]    Feeling of Stress : Not at all  Social Connections: Moderately Isolated (06/05/2022)   Social Connection and Isolation Panel [NHANES]    Frequency of Communication with Friends and Family: More  than three times a week    Frequency of Social Gatherings with Friends and Family: Never    Attends Religious Services: Never    Marine scientist or Organizations: No    Attends Music therapist: Never    Marital Status: Married    Tobacco Counseling Counseling given: Not Answered   Clinical Intake:  Pre-visit preparation completed: Yes  Pain : 0-10 Pain Score: 2  Pain Type: Chronic pain Pain Location: Hip Pain Orientation: Left     Diabetes: No  How often do you need to have someone help you when you read instructions, pamphlets, or other written materials from your doctor or pharmacy?: 1 - Never  Diabetic?no  Interpreter Needed?: No  Information entered by :: Kirke Shaggy, LPN   Activities of Daily Living    06/05/2022    2:38 PM 06/04/2022    3:52 PM  In your present state of health, do you have any difficulty performing the following activities:  Hearing? 0 0  Vision? 0 0  Difficulty concentrating or making decisions? 0 0  Walking or climbing stairs? 0 0  Dressing or bathing? 0 0  Doing errands, shopping? 0 0  Preparing Food and eating ? N N  Using the Toilet? N N  In the past six months, have you accidently leaked urine? N N  Do you have problems with loss of bowel control? N N  Managing your Medications? N N  Managing your Finances? N N  Housekeeping or managing your Housekeeping? N N    Patient Care Team: Olin Hauser, DO as PCP - General (Family Medicine) Bary Castilla Forest Gleason, MD (General Surgery)  Indicate any recent Medical Services you may have received from other than [facility] providers in the past year (date may be approximate).     Assessment:   This is a routine wellness examination for [place].  Hearing/Vision screen Hearing Screening - Comments:: No aids Vision Screening - Comments:: Wears glasses- Dr.King  Dietary issues and exercise activities discussed: Current Exercise Habits: Home exercise routine, Type  of exercise: walking, Time (Minutes): 60, Frequency (Times/Week): 7, Weekly Exercise (Minutes/Week): 420, Intensity: Mild   Goals Addressed             This Visit's Progress    DIET - INCREASE WATER INTAKE         Depression Screen    06/05/2022    2:35 PM 05/13/2022    9:34 AM 05/30/2021    2:04 PM 03/05/2020    1:44 PM 02/02/2019    1:36 PM 09/25/2016    8:14 AM  PHQ 2/9 Scores  PHQ - 2 Score 0  0 0 0 0 0  PHQ- 9 Score 0 0  1  1    Fall Risk    06/05/2022    2:37 PM 06/04/2022    3:52 PM 05/13/2022    9:34 AM 05/30/2021    2:08 PM 03/05/2020    1:45 PM  Fall Risk   Falls in the past year? 0 0 0 0 0  Number falls in past yr: 0 0 0 0 0  Injury with Fall? 0 0 0 0 0  Risk for fall due to : No Fall Risks  No Fall Risks No Fall Risks   Follow up Falls prevention discussed;Falls evaluation completed  Falls evaluation completed Falls evaluation completed Falls evaluation completed    FALL RISK PREVENTION PERTAINING TO THE HOME:  Any stairs in or around the home? Yes  If so, are there any without handrails? No  Home free of loose throw rugs in walkways, pet beds, electrical cords, etc? Yes  Adequate lighting in your home to reduce risk of falls? Yes   ASSISTIVE DEVICES UTILIZED TO PREVENT FALLS:  Life alert? No  Use of a cane, walker or w/c? No  Grab bars in the bathroom? Yes  Shower chair or bench in shower? Yes  Elevated toilet seat or a handicapped toilet? Yes   TIMED UP AND GO:  Was the test performed? Yes .  Length of time to ambulate 10 feet: 4 sec.   Gait steady and fast without use of assistive device  Cognitive Function:        06/05/2022    2:45 PM  6CIT Screen  What Year? 0 points  What month? 0 points  What time? 0 points  Count back from 20 0 points  Months in reverse 0 points  Repeat phrase 0 points  Total Score 0 points    Immunizations Immunization History  Administered Date(s) Administered   Fluad Quad(high Dose 65+) 12/30/2021    Influenza Split 12/18/2011   Influenza, High Dose Seasonal PF 11/23/2017, 11/03/2018, 12/01/2019, 12/25/2020   Influenza-Unspecified 11/26/2016, 11/03/2018, 12/01/2019   PFIZER(Purple Top)SARS-COV-2 Vaccination 05/09/2019, 05/30/2019, 01/04/2020   Pfizer Covid-19 Vaccine Bivalent Booster 70yrs & up 01/02/2021, 03/31/2022   Pneumococcal Conjugate-13 02/25/2017, 11/03/2018   Pneumococcal Polysaccharide-23 02/05/2021   Respiratory Syncytial Virus Vaccine,Recomb Aduvanted(Arexvy) 01/20/2022   Rsv, Bivalent, Protein Subunit Rsvpref,pf Evans Lance) 01/20/2022   Tdap 02/25/2017   Zoster Recombinat (Shingrix) 10/30/2017, 11/10/2018   Zoster, Live 05/17/2012    TDAP status: Up to date  Flu Vaccine status: Up to date  Pneumococcal vaccine status: Up to date  Covid-19 vaccine status: Completed vaccines  Qualifies for Shingles Vaccine? Yes   Zostavax completed Yes   Shingrix Completed?: Yes  Screening Tests Health Maintenance  Topic Date Due   COVID-19 Vaccine (6 - 2023-24 season) 05/26/2022   COLONOSCOPY (Pts 45-46yrs Insurance coverage will need to be confirmed)  09/08/2022   Medicare Annual Wellness (AWV)  06/05/2023   DTaP/Tdap/Td (2 - Td or Tdap) 02/26/2027   Pneumonia Vaccine 67+ Years old  Completed   INFLUENZA VACCINE  Completed   Hepatitis C Screening  Completed   Zoster Vaccines- Shingrix  Completed   HPV VACCINES  Aged Out    Health Maintenance  Health Maintenance Due  Topic Date Due   COVID-19 Vaccine (6 - 2023-24 season) 05/26/2022    Colorectal cancer screening: Type of screening: Colonoscopy. Completed 09/07/12. Repeat every 10 years  Lung Cancer Screening: (Low Dose CT Chest recommended if Age 31-80 years, 45  pack-year currently smoking OR have quit w/in 15years.) does not qualify.   Additional Screening:  Hepatitis C Screening: does qualify; Completed 04/01/17  Vision Screening: Recommended annual ophthalmology exams for early detection of glaucoma and other  disorders of the eye. Is the patient up to date with their annual eye exam?  Yes  Who is the provider or what is the name of the office in which the patient attends annual eye exams? Dr.King If pt is not established with a provider, would they like to be referred to a provider to establish care? No .   Dental Screening: Recommended annual dental exams for proper oral hygiene  Community Resource Referral / Chronic Care Management: CRR required this visit?  No   CCM required this visit?  No      Plan:     I have personally reviewed and noted the following in the patient's chart:   Medical and social history Use of alcohol, tobacco or illicit drugs  Current medications and supplements including opioid prescriptions. Patient is not currently taking opioid prescriptions. Functional ability and status Nutritional status Physical activity Advanced directives List of other physicians Hospitalizations, surgeries, and ER visits in previous 12 months Vitals Screenings to include cognitive, depression, and falls Referrals and appointments  In addition, I have reviewed and discussed with patient certain preventive protocols, quality metrics, and best practice recommendations. A written personalized care plan for preventive services as well as general preventive health recommendations were provided to patient.     Dionisio David, LPN   [id number]   Nurse Notes: none

## 2022-06-05 NOTE — Assessment & Plan Note (Signed)
BPH Chronic with elevated PSA 4-6 range Trial on Saw Palmetto for BPH symptoms  No prior biopsy. Has had elevated PSA/BPH 30+ years  PSA 5.23, as per our previous discussion, this is a concern for me now PSA has increased again beyond 5, increase velocity of almost 1.0 in 6 months could warrant further evaluation by Urology - however we have discussed in detail today and he prefers to defer Urology consult. We agree to repeat in 6 months to continue close monitoring. If trend continues to increase or reaches >6 we would pursue referral. Otherwise if still 4-5.5 range we can continue q 6 month monitoring  He is aware of the risks with prostate cancer and this screening process.  Labs 6 months

## 2022-06-05 NOTE — Assessment & Plan Note (Signed)
Controlled BP. Repeat manual and home BP cuff checked Home readings reviewed   Continues Lisinopril-HCTZ 10-12.5mg  every other day and Metoprolol XL 25mg  daily We have tried adjusting his dosages and unsuccessful Okay to keep intermittent dosing if he prefers at this time, seems to be stable based off of readings If has variability with BP on days he does not take med, would suggest taking daily if tolerated

## 2022-06-05 NOTE — Progress Notes (Signed)
Subjective:    Patient ID: Warren Jackson, male    DOB: 10-16-1950, 72 y.o.   MRN: [id number]  Warren Jackson is a 72 y.o. male presenting on 06/05/2022 for Annual Exam   HPI  Here for Annual Physical and Lab Review  Elevated PSA BPH Prior diagnosis since 1991 Last PSA trend 4.35 (11/2021) > 5/23 (05/2022) Previously on [facility], limited results, discontinued No prior prostate biopsy. He does well during day. Bathroom before going for walk Not having significant problems during the day Seems to only have prostate BPH symptoms at night    CHRONIC HTN: Tried off these meds previously at once. Needed BB Home BP readings none recently. Current Meds - Lisinopril-HCTZ 10-12.5mg  every other day, Metoprolol XL 25mg  daily   Reports good compliance. Tolerating well, w/o complaints. Walking daily for exercise Admits postural dizziness  Denies CP, dyspnea, HA, edema, dizziness / lightheadedness  HYPERLIPIDEMIA: - Reports concerns LDL stable 92, now < 100 - Currently taking Lovastatin 40mg , tolerating well without side effects or myalgias  Insomnia He had CPAP previously, he was taking it off and turning Had a sleep study previously and inaccurate  He takes OTC Diphenhydramine PRN as sleep aid PRN, able to fall asleep fairly well, and he can go back to sleep after waking up overnight Has tried Ambien in the past. He typically takes a nap in the afternoon, as a pattern.   Elevated A1c Last lab A1c 5.7, previously 5.6 No new concerns. He has fam history of grandparent with DM  Elevated TSH TSH 4.53, previous 3.8 normal, and prior highest was 3 years ago at 5.0 Add Free T4 next time    Health Maintenance: Colonoscopy completed 6/20214, next due 10 years, 2024. Repeat colonoscopy referral placed today      06/05/2022    2:35 PM 05/13/2022    9:34 AM 05/30/2021    2:04 PM  Depression screen PHQ 2/9  Decreased Interest 0 0 0  Down, Depressed, Hopeless 0 0 0  PHQ - 2  Score 0 0 0  Altered sleeping 0 0   Tired, decreased energy 0 0   Change in appetite 0 0   Feeling bad or failure about yourself  0 0   Trouble concentrating 0 0   Moving slowly or fidgety/restless 0 0   Suicidal thoughts 0 0   PHQ-9 Score 0 0   Difficult doing work/chores Not difficult at all Not difficult at all     Past Medical History:  Diagnosis Date   Allergy    Bone spur    Elevated PSA    Hernia 03/16/2012   Hyperlipidemia    Hypertension    Past Surgical History:  Procedure Laterality Date   BACK SURGERY  2008   KNEE ARTHROSCOPY Left    KNEE SURGERY Left [id number]   TONSILLECTOMY AND ADENOIDECTOMY     Social History   Socioeconomic History   Marital status: Married    Spouse name: Not on file   Number of children: Not on file   Years of education: Not on file   Highest education level: Not on file  Occupational History   Not on file  Tobacco Use   Smoking status: Never   Smokeless tobacco: Never  Vaping Use   Vaping Use: Never used  Substance and Sexual Activity   Alcohol use: Yes    Comment: rarely   Drug use: No   Sexual activity: Not on file  Other Topics Concern  Not on file  Social History Narrative   Not on file   Social Determinants of Health   Financial Resource Strain: Low Risk  (06/05/2022)   Overall Financial Resource Strain (CARDIA)    Difficulty of Paying Living Expenses: Not hard at all  Food Insecurity: No Food Insecurity (06/05/2022)   Hunger Vital Sign    Worried About Running Out of Food in the Last Year: Never true    Ran Out of Food in the Last Year: Never true  Transportation Needs: No Transportation Needs (06/05/2022)   PRAPARE - Hydrologist (Medical): No    Lack of Transportation (Non-Medical): No  Physical Activity: Sufficiently Active (06/05/2022)   Exercise Vital Sign    Days of Exercise per Week: 7 days    Minutes of Exercise per Session: 60 min  Stress: No Stress Concern Present  (06/05/2022)   Park    Feeling of Stress : Not at all  Social Connections: Moderately Isolated (06/05/2022)   Social Connection and Isolation Panel [NHANES]    Frequency of Communication with Friends and Family: More than three times a week    Frequency of Social Gatherings with Friends and Family: Never    Attends Religious Services: Never    Marine scientist or Organizations: No    Attends Archivist Meetings: Never    Marital Status: Married  Human resources officer Violence: Not At Risk (06/05/2022)   Humiliation, Afraid, Rape, and Kick questionnaire    Fear of Current or Ex-Partner: No    Emotionally Abused: No    Physically Abused: No    Sexually Abused: No   Family History  Problem Relation Age of Onset   Peptic Ulcer Disease Mother    Cancer Mother    Heart attack Father    Dementia Maternal Grandmother    Diabetes Maternal Grandfather    Heart attack Paternal Grandmother    CVA Paternal Grandmother    Alcohol abuse Paternal Grandfather    Healthy Daughter    Current Outpatient Medications on File Prior to Visit  Medication Sig   aspirin 81 MG tablet Take 81 mg by mouth daily.   Cetirizine HCl (ZYRTEC ALLERGY PO)    diphenhydrAMINE HCl, Sleep, 50 MG CAPS Take 1 capsule by mouth at bedtime as needed.   lisinopril-hydrochlorothiazide (ZESTORETIC) 10-12.5 MG tablet Take 1 tablet by mouth daily.   lovastatin (MEVACOR) 40 MG tablet TAKE 1 TABLET BY MOUTH EVERYDAY AT BEDTIME   metoprolol succinate (TOPROL-XL) 25 MG 24 hr tablet Take 1 tablet (25 mg total) by mouth daily.   No current facility-administered medications on file prior to visit.    Review of Systems  Constitutional:  Negative for activity change, appetite change, chills, diaphoresis, fatigue and fever.  HENT:  Negative for congestion and hearing loss.   Eyes:  Negative for visual disturbance.  Respiratory:  Negative for cough,  chest tightness, shortness of breath and wheezing.   Cardiovascular:  Negative for chest pain, palpitations and leg swelling.  Gastrointestinal:  Negative for abdominal pain, constipation, diarrhea, nausea and vomiting.  Genitourinary:  Negative for dysuria, frequency and hematuria.  Musculoskeletal:  Negative for arthralgias and neck pain.  Skin:  Negative for rash.  Neurological:  Negative for dizziness, weakness, light-headedness, numbness and headaches.  Hematological:  Negative for adenopathy.  Psychiatric/Behavioral:  Negative for behavioral problems, dysphoric mood and sleep disturbance.    Per HPI unless specifically indicated  above      Objective:    BP 120/80   Pulse 61   Ht 5\' 9"  (1.753 m)   Wt 185 lb (83.9 kg)   SpO2 97%   BMI 27.32 kg/m   Wt Readings from Last 3 Encounters:  06/05/22 185 lb (83.9 kg)  06/05/22 185 lb 12.8 oz (84.3 kg)  05/13/22 185 lb (83.9 kg)    Physical Exam Vitals and nursing note reviewed.  Constitutional:      General: He is not in acute distress.    Appearance: He is well-developed. He is not diaphoretic.     Comments: Well-appearing, comfortable, cooperative  HENT:     Head: Normocephalic and atraumatic.  Eyes:     General:        Right eye: No discharge.        Left eye: No discharge.     Conjunctiva/sclera: Conjunctivae normal.     Pupils: Pupils are equal, round, and reactive to light.  Neck:     Thyroid: No thyromegaly.     Vascular: No carotid bruit.  Cardiovascular:     Rate and Rhythm: Normal rate and regular rhythm.     Pulses: Normal pulses.     Heart sounds: Normal heart sounds. No murmur heard. Pulmonary:     Effort: Pulmonary effort is normal. No respiratory distress.     Breath sounds: Normal breath sounds. No wheezing or rales.  Abdominal:     General: Bowel sounds are normal. There is no distension.     Palpations: Abdomen is soft. There is no mass.     Tenderness: There is no abdominal tenderness.   Musculoskeletal:        General: No tenderness. Normal range of motion.     Cervical back: Normal range of motion and neck supple.     Right lower leg: No edema.     Left lower leg: No edema.     Comments: Upper / Lower Extremities: - Normal muscle tone, strength bilateral upper extremities 5/5, lower extremities 5/5  Lymphadenopathy:     Cervical: No cervical adenopathy.  Skin:    General: Skin is warm and dry.     Findings: No erythema or rash.  Neurological:     Mental Status: He is alert and oriented to person, place, and time.     Comments: Distal sensation intact to light touch all extremities  Psychiatric:        Mood and Affect: Mood normal.        Behavior: Behavior normal.        Thought Content: Thought content normal.     Comments: Well groomed, good eye contact, normal speech and thoughts      Results for orders placed or performed in visit on 05/29/22  TSH  Result Value Ref Range   TSH 2.77 0.40 - 4.50 mIU/L  PSA  Result Value Ref Range   PSA 5.23 (H) < OR = 4.00 ng/mL  Hemoglobin A1c  Result Value Ref Range   Hgb A1c MFr Bld 5.7 (H) <5.7 % of total Hgb   Mean Plasma Glucose 117 mg/dL   eAG (mmol/L) 6.5 mmol/L  Lipid panel  Result Value Ref Range   Cholesterol 160 <200 mg/dL   HDL 48 > OR = 40 mg/dL   Triglycerides 103 <150 mg/dL   LDL Cholesterol (Calc) 92 mg/dL (calc)   Total CHOL/HDL Ratio 3.3 <5.0 (calc)   Non-HDL Cholesterol (Calc) 112 <130 mg/dL (calc)  CBC with  Differential/Platelet  Result Value Ref Range   WBC 6.0 3.8 - 10.8 Thousand/uL   RBC 5.68 4.20 - 5.80 Million/uL   Hemoglobin 16.5 13.2 - 17.1 g/dL   HCT 49.8 38.5 - 50.0 %   MCV 87.7 80.0 - 100.0 fL   MCH 29.0 27.0 - 33.0 pg   MCHC 33.1 32.0 - 36.0 g/dL   RDW 13.1 11.0 - 15.0 %   Platelets 225 140 - 400 Thousand/uL   MPV 10.1 7.5 - 12.5 fL   Neutro Abs 3,876 1,500 - 7,800 cells/uL   Lymphs Abs 1,380 850 - 3,900 cells/uL   Absolute Monocytes 426 200 - 950 cells/uL   Eosinophils  Absolute 288 15 - 500 cells/uL   Basophils Absolute 30 0 - 200 cells/uL   Neutrophils Relative % 64.6 %   Total Lymphocyte 23.0 %   Monocytes Relative 7.1 %   Eosinophils Relative 4.8 %   Basophils Relative 0.5 %  COMPLETE METABOLIC PANEL WITH GFR  Result Value Ref Range   Glucose, Bld 100 (H) 65 - 99 mg/dL   BUN 28 (H) 7 - 25 mg/dL   Creat 1.07 0.70 - 1.28 mg/dL   eGFR 75 > OR = 60 mL/min/1.66m2   BUN/Creatinine Ratio 26 (H) 6 - 22 (calc)   Sodium 141 135 - 146 mmol/L   Potassium 4.3 3.5 - 5.3 mmol/L   Chloride 104 98 - 110 mmol/L   CO2 30 20 - 32 mmol/L   Calcium 9.7 8.6 - 10.3 mg/dL   Total Protein 7.1 6.1 - 8.1 g/dL   Albumin 4.5 3.6 - 5.1 g/dL   Globulin 2.6 1.9 - 3.7 g/dL (calc)   AG Ratio 1.7 1.0 - 2.5 (calc)   Total Bilirubin 0.8 0.2 - 1.2 mg/dL   Alkaline phosphatase (APISO) 59 35 - 144 U/L   AST 19 10 - 35 U/L   ALT 14 9 - 46 U/L      Assessment & Plan:   Problem List Items Addressed This Visit     Benign prostatic hyperplasia with nocturia    BPH Chronic with elevated PSA 4-6 range Trial on [place] for BPH symptoms  No prior biopsy. Has had elevated PSA/BPH 30+ years  PSA 5.23, as per our previous discussion, this is a concern for me now PSA has increased again beyond 5, increase velocity of almost 1.0 in 6 months could warrant further evaluation by Urology - however we have discussed in detail today and he prefers to defer Urology consult. We agree to repeat in 6 months to continue close monitoring. If trend continues to increase or reaches >6 we would pursue referral. Otherwise if still 4-5.5 range we can continue q 6 month monitoring  He is aware of the risks with prostate cancer and this screening process.  Labs 6 months      Essential (primary) hypertension    Controlled BP. Repeat manual and home BP cuff checked Home readings reviewed   Continues Lisinopril-HCTZ 10-12.5mg  every other day and Metoprolol XL 25mg  daily We have tried adjusting his  dosages and unsuccessful Okay to keep intermittent dosing if he prefers at this time, seems to be stable based off of readings If has variability with BP on days he does not take med, would suggest taking daily if tolerated      Hypercholesterolemia without hypertriglyceridemia    Stable LDL 92 Last lipid panel 05/2022 The 10-year ASCVD risk score (Arnett DK, et al., 2019) is: 17.2%  Plan: 1.  Continue current meds - Lovastatin 40mg  - Discussed adjusting dose down - however reducing from moderate intensity to low intensity would mostly just reduce his ASCVD risk prevention in future - we agree to keep same dose since he is tolerating well  2. Encourage improved lifestyle - low carb/cholesterol, reduce portion size, continue improving regular exercise      Other Visit Diagnoses     Annual physical exam    -  Primary   Elevated PSA, less than 10 ng/ml       Elevated hemoglobin A1c       Screening for colon cancer       Relevant Orders   Ambulatory referral to Gastroenterology       Updated Health Maintenance information Reviewed recent lab results with patient A1c stable 5.7, borderline elevated TSH seems to be elevated at times then back to normal, check TSH + T4 next time Encouraged improvement to lifestyle with diet and exercise Goal maintain healthy weight.    Referral to GI for Colonoscopy after 09/08/22, for 10 yr surveillance.  Orders Placed This Encounter  Procedures   Ambulatory referral to Gastroenterology    Referral Priority:   Routine    Referral Type:   Consultation    Referral Reason:   Specialty Services Required    Number of Visits Requested:   1      No orders of the defined types were placed in this encounter.     Follow up plan: Return in about 6 months (around 12/06/2022) for 6 month fasting lab only then Follow-up 1 week later Lab results discussion.  Future labs PSA, TSH, Free T4, A1c  Nobie Putnam, DO [facility] 06/05/2022, 3:22 PM

## 2022-06-05 NOTE — Assessment & Plan Note (Signed)
Stable LDL 92 Last lipid panel 05/2022 The 10-year ASCVD risk score (Arnett DK, et al., 2019) is: 17.2%  Plan: 1. Continue current meds - Lovastatin 40mg  - Discussed adjusting dose down - however reducing from moderate intensity to low intensity would mostly just reduce his ASCVD risk prevention in future - we agree to keep same dose since he is tolerating well  2. Encourage improved lifestyle - low carb/cholesterol, reduce portion size, continue improving regular exercise

## 2022-06-05 NOTE — Patient Instructions (Signed)
Warren Jackson , Thank you for taking time to come for your Medicare Wellness Visit. I appreciate your ongoing commitment to your health goals. Please review the following plan we discussed and let me know if I can assist you in the future.   These are the goals we discussed:  Goals      DIET - EAT MORE FRUITS AND VEGETABLES     DIET - INCREASE WATER INTAKE        This is a list of the screening recommended for you and due dates:  Health Maintenance  Topic Date Due   COVID-19 Vaccine (6 - 2023-24 season) 05/26/2022   Colon Cancer Screening  09/08/2022   Medicare Annual Wellness Visit  06/05/2023   DTaP/Tdap/Td vaccine (2 - Td or Tdap) 02/26/2027   Pneumonia Vaccine  Completed   Flu Shot  Completed   Hepatitis C Screening: USPSTF Recommendation to screen - Ages 18-79 yo.  Completed   Zoster (Shingles) Vaccine  Completed   HPV Vaccine  Aged Out    Advanced directives: no  Conditions/risks identified: none  Next appointment: Follow up in one year for your annual wellness visit. 06/11/23 @ 2:30 pm in person  Preventive Care 65 Years and Older, Male  Preventive care refers to lifestyle choices and visits with your health care provider that can promote health and wellness. What does preventive care include? A yearly physical exam. This is also called an annual well check. Dental exams once or twice a year. Routine eye exams. Ask your health care provider how often you should have your eyes checked. Personal lifestyle choices, including: Daily care of your teeth and gums. Regular physical activity. Eating a healthy diet. Avoiding tobacco and drug use. Limiting alcohol use. Practicing safe sex. Taking low doses of aspirin every day. Taking vitamin and mineral supplements as recommended by your health care provider. What happens during an annual well check? The services and screenings done by your health care provider during your annual well check will depend on your age, overall  health, lifestyle risk factors, and family history of disease. Counseling  Your health care provider may ask you questions about your: Alcohol use. Tobacco use. Drug use. Emotional well-being. Home and relationship well-being. Sexual activity. Eating habits. History of falls. Memory and ability to understand (cognition). Work and work Statistician. Screening  You may have the following tests or measurements: Height, weight, and BMI. Blood pressure. Lipid and cholesterol levels. These may be checked every 5 years, or more frequently if you are over 35 years old. Skin check. Lung cancer screening. You may have this screening every year starting at age 67 if you have a 30-pack-year history of smoking and currently smoke or have quit within the past 15 years. Fecal occult blood test (FOBT) of the stool. You may have this test every year starting at age 25. Flexible sigmoidoscopy or colonoscopy. You may have a sigmoidoscopy every 5 years or a colonoscopy every 10 years starting at age 9. Prostate cancer screening. Recommendations will vary depending on your family history and other risks. Hepatitis C blood test. Hepatitis B blood test. Sexually transmitted disease (STD) testing. Diabetes screening. This is done by checking your blood sugar (glucose) after you have not eaten for a while (fasting). You may have this done every 1-3 years. Abdominal aortic aneurysm (AAA) screening. You may need this if you are a current or former smoker. Osteoporosis. You may be screened starting at age 20 if you are at high risk.  Talk with your health care provider about your test results, treatment options, and if necessary, the need for more tests. Vaccines  Your health care provider may recommend certain vaccines, such as: Influenza vaccine. This is recommended every year. Tetanus, diphtheria, and acellular pertussis (Tdap, Td) vaccine. You may need a Td booster every 10 years. Zoster vaccine. You may  need this after age 7. Pneumococcal 13-valent conjugate (PCV13) vaccine. One dose is recommended after age 38. Pneumococcal polysaccharide (PPSV23) vaccine. One dose is recommended after age 20. Talk to your health care provider about which screenings and vaccines you need and how often you need them. This information is not intended to replace advice given to you by your health care provider. Make sure you discuss any questions you have with your health care provider. Document Released: 03/29/2015 Document Revised: 11/20/2015 Document Reviewed: 01/01/2015 Elsevier Interactive Patient Education  2017 [place] Prevention in the Home Falls can cause injuries. They can happen to people of all ages. There are many things you can do to make your home safe and to help prevent falls. What can I do on the outside of my home? Regularly fix the edges of walkways and driveways and fix any cracks. Remove anything that might make you trip as you walk through a door, such as a raised step or threshold. Trim any bushes or trees on the path to your home. Use bright outdoor lighting. Clear any walking paths of anything that might make someone trip, such as rocks or tools. Regularly check to see if handrails are loose or broken. Make sure that both sides of any steps have handrails. Any raised decks and porches should have guardrails on the edges. Have any leaves, snow, or ice cleared regularly. Use sand or salt on walking paths during winter. Clean up any spills in your garage right away. This includes oil or grease spills. What can I do in the bathroom? Use night lights. Install grab bars by the toilet and in the tub and shower. Do not use towel bars as grab bars. Use non-skid mats or decals in the tub or shower. If you need to sit down in the shower, use a plastic, non-slip stool. Keep the floor dry. Clean up any water that spills on the floor as soon as it happens. Remove soap buildup in  the tub or shower regularly. Attach bath mats securely with double-sided non-slip rug tape. Do not have throw rugs and other things on the floor that can make you trip. What can I do in the bedroom? Use night lights. Make sure that you have a light by your bed that is easy to reach. Do not use any sheets or blankets that are too big for your bed. They should not hang down onto the floor. Have a firm chair that has side arms. You can use this for support while you get dressed. Do not have throw rugs and other things on the floor that can make you trip. What can I do in the kitchen? Clean up any spills right away. Avoid walking on wet floors. Keep items that you use a lot in easy-to-reach places. If you need to reach something above you, use a strong step stool that has a grab bar. Keep electrical cords out of the way. Do not use floor polish or wax that makes floors slippery. If you must use wax, use non-skid floor wax. Do not have throw rugs and other things on the floor that can make  you trip. What can I do with my stairs? Do not leave any items on the stairs. Make sure that there are handrails on both sides of the stairs and use them. Fix handrails that are broken or loose. Make sure that handrails are as long as the stairways. Check any carpeting to make sure that it is firmly attached to the stairs. Fix any carpet that is loose or worn. Avoid having throw rugs at the top or bottom of the stairs. If you do have throw rugs, attach them to the floor with carpet tape. Make sure that you have a light switch at the top of the stairs and the bottom of the stairs. If you do not have them, ask someone to add them for you. What else can I do to help prevent falls? Wear shoes that: Do not have high heels. Have rubber bottoms. Are comfortable and fit you well. Are closed at the toe. Do not wear sandals. If you use a stepladder: Make sure that it is fully opened. Do not climb a closed  stepladder. Make sure that both sides of the stepladder are locked into place. Ask someone to hold it for you, if possible. Clearly mark and make sure that you can see: Any grab bars or handrails. First and last steps. Where the edge of each step is. Use tools that help you move around (mobility aids) if they are needed. These include: Canes. Walkers. Scooters. Crutches. Turn on the lights when you go into a dark area. Replace any light bulbs as soon as they burn out. Set up your furniture so you have a clear path. Avoid moving your furniture around. If any of your floors are uneven, fix them. If there are any pets around you, be aware of where they are. Review your medicines with your doctor. Some medicines can make you feel dizzy. This can increase your chance of falling. Ask your doctor what other things that you can do to help prevent falls. This information is not intended to replace advice given to you by your health care provider. Make sure you discuss any questions you have with your health care provider. Document Released: 12/27/2008 Document Revised: 08/08/2015 Document Reviewed: 04/06/2014 Elsevier Interactive Patient Education  2017 [employer].

## 2022-06-05 NOTE — Patient Instructions (Addendum)
Thank you for coming to the office today.  1. PSA 5.23, as per our previous discussion, this is a concern for me now PSA has increased again beyond 5, increase velocity of almost 1.0 in 6 months would warrant further evaluation by Urology. Again hopefully it is still just related to enlarged prostate  From our discussion, I would agree we should repeat PSA in 6 months, if the result is >6 we can reconsider referral to Urologist.   2. Chemistry - Mostly Normal results, including electrolytes, kidney and liver function. - Normal fasting blood sugar, should be 100 or below and it was right on the line. It is stable from previous results   3. Hemoglobin A1c (Diabetes screening) - 5.7, slightly elevated from last result 5.6, this is barely in range of Pre-Diabetes (>5.7 to 6.4). No treatment required, goal to limit excess carb starches sugars in diet.   4. TSH Thyroid Function Tests - Normal 2.77, previously 4.53 range, now back to normal. No treatment required. We can keep a watch on this lab level and add a 2nd test next time.   5. CBC Blood Counts - Normal, no anemia, no other significant abnormality  -------------------  Referral to GI - for colonoscopy screening.  [facility]) [address] [place], [place] [address] Phone: [phone]   DUE for FASTING BLOOD WORK (no food or drink after midnight before the lab appointment, only water or coffee without cream/sugar on the morning of)  SCHEDULE "Lab Only" visit in the morning at the clinic for lab draw in 6 MONTHS   - Make sure Lab Only appointment is at about 1 week before your next appointment, so that results will be available  For Lab Results, once available within 2-3 days of blood draw, you can can log in to MyChart online to view your results and a brief explanation. Also, we can discuss results at next follow-up visit.    Please schedule a Follow-up Appointment to: Return in about 6  months (around 12/06/2022) for 6 month fasting lab only then Follow-up 1 week later Lab results discussion.  If you have any other questions or concerns, please feel free to call the office or send a message through [facility]. You may also schedule an earlier appointment if necessary.  Additionally, you may be receiving a survey about your experience at our office within a few days to 1 week by e-mail or mail. We value your feedback.  Nobie Putnam, DO [place]

## 2022-06-10 ENCOUNTER — Other Ambulatory Visit: Payer: Self-pay

## 2022-06-10 ENCOUNTER — Telehealth: Payer: Self-pay

## 2022-06-10 DIAGNOSIS — Z1211 Encounter for screening for malignant neoplasm of colon: Secondary | ICD-10-CM

## 2022-06-10 MED ORDER — NA SULFATE-K SULFATE-MG SULF 17.5-3.13-1.6 GM/177ML PO SOLN: 1.0000 | Freq: Once | ORAL | 0 refills | Status: AC

## 2022-06-10 NOTE — Telephone Encounter (Signed)
Gastroenterology Pre-Procedure Review  Request Date: 09/21/22 Requesting Physician: Dr. Vicente Males  PATIENT REVIEW QUESTIONS: The patient responded to the following health history questions as indicated:    1. Are you having any GI issues? no 2. Do you have a personal history of Polyps? no 3. Do you have a family history of Colon Cancer or Polyps? no 4. Diabetes Mellitus? no 5. Joint replacements in the past 12 months?no 6. Major health problems in the past 3 months?no 7. Any artificial heart valves, MVP, or defibrillator?no    MEDICATIONS & ALLERGIES:    Patient reports the following regarding taking any anticoagulation/antiplatelet therapy:   Plavix, Coumadin, Eliquis, Xarelto, Lovenox, Pradaxa, Brilinta, or Effient? no Aspirin? yes (81mg  daily)  Patient confirms/reports the following medications:  Current Outpatient Medications  Medication Sig Dispense Refill   aspirin 81 MG tablet Take 81 mg by mouth daily.     Cetirizine HCl (ZYRTEC ALLERGY PO)      diphenhydrAMINE HCl, Sleep, 50 MG CAPS Take 1 capsule by mouth at bedtime as needed.     lisinopril-hydrochlorothiazide (ZESTORETIC) 10-12.5 MG tablet Take 1 tablet by mouth daily. 90 tablet 3   lovastatin (MEVACOR) 40 MG tablet TAKE 1 TABLET BY MOUTH EVERYDAY AT BEDTIME 90 tablet 2   metoprolol succinate (TOPROL-XL) 25 MG 24 hr tablet Take 1 tablet (25 mg total) by mouth daily. 90 tablet 3   No current facility-administered medications for this visit.    Patient confirms/reports the following allergies:  Allergies  Allergen Reactions   Latex Other (See Comments)   Other     Telfa caused redness   Penicillins Rash    No orders of the defined types were placed in this encounter.   AUTHORIZATION INFORMATION Primary Insurance: 1D#: Group #:  Secondary Insurance: 1D#: Group #:  SCHEDULE INFORMATION: Date: 09/21/22 Time: Location: [facility]

## 2022-06-21 ENCOUNTER — Other Ambulatory Visit: Payer: Self-pay | Admitting: Family Medicine

## 2022-06-21 DIAGNOSIS — E7800 Pure hypercholesterolemia, unspecified: Secondary | ICD-10-CM

## 2022-06-23 NOTE — Telephone Encounter (Signed)
Requested Prescriptions  Pending Prescriptions Disp Refills   lovastatin (MEVACOR) 40 MG tablet [Pharmacy Med Name: LOVASTATIN 40 MG TABLET] 90 tablet 1    Sig: TAKE 1 TABLET BY MOUTH EVERYDAY AT BEDTIME     Cardiovascular:  Antilipid - Statins 2 Failed - 06/21/2022 10:07 AM      Failed - Lipid Panel in normal range within the last 12 months    Cholesterol, Total  Date Value Ref Range Status  03/05/2020 168 100 - 199 mg/dL Final   Cholesterol  Date Value Ref Range Status  06/01/2022 160 <200 mg/dL Final   LDL Cholesterol (Calc)  Date Value Ref Range Status  06/01/2022 92 mg/dL (calc) Final    Comment:    Reference range: <100 . Desirable range <100 mg/dL for primary prevention;   <70 mg/dL for patients with CHD or diabetic patients  with > or = 2 CHD risk factors. Marland Kitchen LDL-C is now calculated using the Martin-Hopkins  calculation, which is a validated novel method providing  better accuracy than the Friedewald equation in the  estimation of LDL-C.  Horald Pollen et al. Lenox Ahr. 2951;884(16): 2061-2068  (http://education.[facility].com/faq/FAQ164)    HDL  Date Value Ref Range Status  06/01/2022 48 > OR = 40 mg/dL Final  60/63/0160 50 >10 mg/dL Final   Triglycerides  Date Value Ref Range Status  06/01/2022 103 <150 mg/dL Final         Passed - Cr in normal range and within 360 days    Creat  Date Value Ref Range Status  06/01/2022 1.07 0.70 - 1.28 mg/dL Final         Passed - Patient is not pregnant      Passed - Valid encounter within last 12 months    Recent Outpatient Visits           2 weeks ago Annual physical exam   [place] [facility] Smitty Cords, DO   1 month ago Benign prostatic hyperplasia with nocturia   [place] [facility] Smitty Cords, DO   1 year ago Annual physical exam   [place] [facility] Smitty Cords, DO   1 year ago Benign prostatic hyperplasia  with nocturia   [place] [facility] Hard[place], Netta Neat, DO   4 years ago Blackout spell   [facility] Chrismon, Jodell Cipro, PA-C       Future Appointments             In 5 months Althea Charon, Netta Neat, DO [facility] [facility]

## 2022-09-14 ENCOUNTER — Ambulatory Visit: Payer: Self-pay

## 2022-09-14 ENCOUNTER — Ambulatory Visit: Payer: Medicare PPO | Admitting: Internal Medicine

## 2022-09-14 VITALS — BP 136/86 | HR 56 | Temp 96.8°F | Wt 187.0 lb

## 2022-09-14 DIAGNOSIS — M546 Pain in thoracic spine: Secondary | ICD-10-CM | POA: Diagnosis not present

## 2022-09-14 MED ORDER — NAPROXEN 500 MG PO TABS: 500.0000 mg | ORAL_TABLET | Freq: Two times a day (BID) | ORAL | 0 refills | Status: DC

## 2022-09-14 MED ORDER — METHOCARBAMOL 500 MG PO TABS: 500.0000 mg | ORAL_TABLET | Freq: Two times a day (BID) | ORAL | 0 refills | Status: DC | PRN

## 2022-09-14 NOTE — Patient Instructions (Signed)
Back Exercises The following exercises strengthen the muscles that help to support the trunk (torso) and back. They also help to keep the lower back flexible. Doing these exercises can help to prevent or lessen existing low back pain. If you have back pain or discomfort, try doing these exercises 2-3 times each day or as told by your health care provider. As your pain improves, do them once each day, but increase the number of times that you repeat the steps for each exercise (do more repetitions). To prevent the recurrence of back pain, continue to do these exercises once each day or as told by your health care provider. Do exercises exactly as told by your health care provider and adjust them as directed. It is normal to feel mild stretching, pulling, tightness, or discomfort as you do these exercises, but you should stop right away if you feel sudden pain or your pain gets worse. Exercises Single knee to chest Repeat these steps 3-5 times for each leg: Lie on your back on a firm bed or the floor with your legs extended. 

## 2022-09-14 NOTE — Telephone Encounter (Signed)
     Chief Complaint: Pain middle of back, sharp at times."I think it's muscular." Has tried Motrin. Symptoms: Pain, getting worse Frequency: Few days ago Pertinent Negatives: Patient denies weakness Disposition: [] ED /[] Urgent Care (no appt availability in office) / [x] Appointment(In office/virtual)/ []  [facility]/ [] Home Care/ [] Refused Recommended Disposition /[]  Mobile Bus/ []  Follow-up with PCP Additional Notes: Call back for worsening of symptoms.  Reason for Disposition  [1] SEVERE back pain (e.g., excruciating, unable to do any normal activities) AND [2] not improved 2 hours after pain medicine  Answer Assessment - Initial Assessment Questions 1. ONSET: "When did the pain begin?"      A few days ago 2. LOCATION: "Where does it hurt?" (upper, mid or lower back)     Middle back 3. SEVERITY: "How bad is the pain?"  (e.g., Scale 1-10; mild, moderate, or severe)   - MILD (1-3): Doesn't interfere with normal activities.    - MODERATE (4-7): Interferes with normal activities or awakens from sleep.    - SEVERE (8-10): Excruciating pain, unable to do any normal activities.      Now - 4-5 4. PATTERN: "Is the pain constant?" (e.g., yes, no; constant, intermittent)      Constant 5. RADIATION: "Does the pain shoot into your legs or somewhere else?"     N/a 6. CAUSE:  "What do you think is causing the back pain?"      Unsure 7. BACK OVERUSE:  "Any recent lifting of heavy objects, strenuous work or exercise?"     No 8. MEDICINES: "What have you taken so far for the pain?" (e.g., nothing, acetaminophen, NSAIDS)     Motrin 9. NEUROLOGIC SYMPTOMS: "Do you have any weakness, numbness, or problems with bowel/bladder control?"     No 10. OTHER SYMPTOMS: "Do you have any other symptoms?" (e.g., fever, abdomen pain, burning with urination, blood in urine)       No 11. PREGNANCY: "Is there any chance you are pregnant?" "When was your last menstrual period?"        N/a  Protocols used: Back Pain-A-AH

## 2022-09-14 NOTE — Progress Notes (Signed)
Subjective:    Patient ID: Warren Jackson, male    DOB: 17-Oct-1950, 72 y.o.   MRN: [id number]  HPI  Patient presents to clinic today with complaint of mid back pain.  This started about 1 week ago.  He describes the pain as sharp.  The pain does not radiate but can be worse with movement. He denies numbness, tingling, weakness in his legs. He denies loss of bowel or bladder control. Bending forward and standing seems to make the pain better.  He has tried excedrin, naproxen and used a heating pad with minimal relief of symptoms. He had an L2 surgery in 2008.  Review of Systems     Past Medical History:  Diagnosis Date   Allergy    Bone spur    Elevated PSA    Hernia 03/16/2012   Hyperlipidemia    Hypertension     Current Outpatient Medications  Medication Sig Dispense Refill   aspirin 81 MG tablet Take 81 mg by mouth daily.     Cetirizine HCl (ZYRTEC ALLERGY PO)      diphenhydrAMINE HCl, Sleep, 50 MG CAPS Take 1 capsule by mouth at bedtime as needed.     lisinopril-hydrochlorothiazide (ZESTORETIC) 10-12.5 MG tablet Take 1 tablet by mouth daily. 90 tablet 3   lovastatin (MEVACOR) 40 MG tablet TAKE 1 TABLET BY MOUTH EVERYDAY AT BEDTIME 90 tablet 1   metoprolol succinate (TOPROL-XL) 25 MG 24 hr tablet Take 1 tablet (25 mg total) by mouth daily. 90 tablet 3   No current facility-administered medications for this visit.    Allergies  Allergen Reactions   Latex Other (See Comments)   Other     Telfa caused redness   Penicillins Rash    Family History  Problem Relation Age of Onset   Peptic Ulcer Disease Mother    Cancer Mother    Heart attack Father    Dementia Maternal Grandmother    Diabetes Maternal Grandfather    Heart attack Paternal Grandmother    CVA Paternal Grandmother    Alcohol abuse Paternal Grandfather    Healthy Daughter     Social History   Socioeconomic History   Marital status: Married    Spouse name: Not on file   Number of children: Not on  file   Years of education: Not on file   Highest education level: Bachelor's degree (e.g., BA, AB, BS)  Occupational History   Not on file  Tobacco Use   Smoking status: Never   Smokeless tobacco: Never  Vaping Use   Vaping Use: Never used  Substance and Sexual Activity   Alcohol use: Yes    Comment: rarely   Drug use: No   Sexual activity: Not on file  Other Topics Concern   Not on file  Social History Narrative   Not on file   Social Determinants of Health   Financial Resource Strain: Low Risk  (09/14/2022)   Overall Financial Resource Strain (CARDIA)    Difficulty of Paying Living Expenses: Not hard at all  Food Insecurity: No Food Insecurity (09/14/2022)   Hunger Vital Sign    Worried About Running Out of Food in the Last Year: Never true    Ran Out of Food in the Last Year: Never true  Transportation Needs: No Transportation Needs (09/14/2022)   PRAPARE - Administrator, Civil Service (Medical): No    Lack of Transportation (Non-Medical): No  Physical Activity: Sufficiently Active (09/14/2022)   Exercise Vital  Sign    Days of Exercise per Week: 6 days    Minutes of Exercise per Session: 70 min  Stress: No Stress Concern Present (09/14/2022)   [employer] of Occupational Health - Occupational Stress Questionnaire    Feeling of Stress : Not at all  Social Connections: Unknown (09/14/2022)   Social Connection and Isolation Panel [NHANES]    Frequency of Communication with Friends and Family: More than three times a week    Frequency of Social Gatherings with Friends and Family: Once a week    Attends Religious Services: Patient declined    Database administrator or Organizations: No    Attends Banker Meetings: Never    Marital Status: Married  Catering manager Violence: Not At Risk (06/05/2022)   Humiliation, Afraid, Rape, and Kick questionnaire    Fear of Current or Ex-Partner: No    Emotionally Abused: No    Physically Abused: No    Sexually  Abused: No     Constitutional: Denies fever, malaise, fatigue, headache or abrupt weight changes.  Respiratory: Denies difficulty breathing, shortness of breath, cough or sputum production.   Cardiovascular: Denies chest pain, chest tightness, palpitations or swelling in the hands or feet.  Gastrointestinal: Denies abdominal pain, bloating, constipation, diarrhea or blood in the stool.  GU: Denies urgency, frequency, pain with urination, burning sensation, blood in urine, odor or discharge. Musculoskeletal: Patient reports bilateral back pain.  Denies decrease in range of motion, difficulty with gait, or joint swelling.  Skin: Denies redness, rashes, lesions or ulcercations.  Neurological: Denies numbness, tingling, weakness or problems with balance and coordination.    No other specific complaints in a complete review of systems (except as listed in HPI above).  Objective:   Physical Exam   BP 136/86 (BP Location: Left Arm, Patient Position: Sitting, Cuff Size: Normal)   Pulse (!) 56   Temp (!) 96.8 F (36 C) (Temporal)   Wt 187 lb (84.8 kg)   SpO2 99%   BMI 27.62 kg/m   Wt Readings from Last 3 Encounters:  06/05/22 185 lb (83.9 kg)  06/05/22 185 lb 12.8 oz (84.3 kg)  05/13/22 185 lb (83.9 kg)    General: Appears his stated age, overweight, in NAD. Skin: Warm, dry and intact. No rashes noted. Cardiovascular: Bradycardic with normal rhythm. S1,S2 noted.  No murmur, rubs or gallops noted. Pulmonary/Chest: Normal effort and positive vesicular breath sounds. No respiratory distress. No wheezes, rales or ronchi noted.  Musculoskeletal: Normal flexion, rotation of the spine.  Pain with extension and lateral bending of the spine.  No bony tenderness noted over the thoracic or lumbar spine.  Pain with palpation of the right parathoracic muscles.  Strength 5/5 BUE/BLE although he has back pain with resistance to flexion of BUE. Neurological: Alert and oriented.  Coordination normal.      BMET    Component Value Date/Time   NA 141 06/01/2022 0920   NA 139 03/05/2020 1536   K 4.3 06/01/2022 0920   CL 104 06/01/2022 0920   CO2 30 06/01/2022 0920   GLUCOSE 100 (H) 06/01/2022 0920   BUN 28 (H) 06/01/2022 0920   BUN 28 (H) 03/05/2020 1536   CREATININE 1.07 06/01/2022 0920   CALCIUM 9.7 06/01/2022 0920   GFRNONAA 58 (L) 03/05/2020 1536   GFRAA 67 03/05/2020 1536    Lipid Panel     Component Value Date/Time   CHOL 160 06/01/2022 0920   CHOL 168 03/05/2020 1536  TRIG 103 06/01/2022 0920   HDL 48 06/01/2022 0920   HDL 50 03/05/2020 1536   CHOLHDL 3.3 06/01/2022 0920   LDLCALC 92 06/01/2022 0920    CBC    Component Value Date/Time   WBC 6.0 06/01/2022 0920   RBC 5.68 06/01/2022 0920   HGB 16.5 06/01/2022 0920   HGB 17.1 03/05/2020 1536   HCT 49.8 06/01/2022 0920   HCT 50.8 03/05/2020 1536   PLT 225 06/01/2022 0920   PLT 246 03/05/2020 1536   MCV 87.7 06/01/2022 0920   MCV 87 03/05/2020 1536   MCH 29.0 06/01/2022 0920   MCHC 33.1 06/01/2022 0920   RDW 13.1 06/01/2022 0920   RDW 12.6 03/05/2020 1536   LYMPHSABS 1,380 06/01/2022 0920   LYMPHSABS 1.8 03/05/2020 1536   EOSABS 288 06/01/2022 0920   EOSABS 0.2 03/05/2020 1536   BASOSABS 30 06/01/2022 0920   BASOSABS 0.0 03/05/2020 1536    Hgb A1C Lab Results  Component Value Date   HGBA1C 5.7 (H) 06/01/2022          Assessment & Plan:   Acute right-sided thoracic back pain:  Appears muscular in nature Continue heat Stretching exercises given Encourage massage Rx for naproxen 500 mg twice daily x 7 days-consume with food Rx for methocarbamol 500 mg twice daily x 7 days-to sedation caution given No indication for imaging at this time.  Follow-up with your PCP as previously scheduled Nicki Reaper, NP

## 2022-09-21 ENCOUNTER — Encounter
Admission: RE | Disposition: A | Discharge status: Home / Self Care | Payer: Self-pay | Source: Home / Self Care | Attending: Gastroenterology

## 2022-09-21 ENCOUNTER — Ambulatory Visit
Admission: RE | Admit: 2022-09-21 | Discharge: 2022-09-21 | Disposition: A | Discharge status: Home / Self Care | Payer: Medicare PPO | Attending: Gastroenterology | Admitting: Gastroenterology

## 2022-09-21 ENCOUNTER — Ambulatory Visit: Payer: Medicare PPO | Admitting: Anesthesiology

## 2022-09-21 DIAGNOSIS — I10 Essential (primary) hypertension: Secondary | ICD-10-CM | POA: Diagnosis not present

## 2022-09-21 DIAGNOSIS — D126 Benign neoplasm of colon, unspecified: Secondary | ICD-10-CM | POA: Diagnosis not present

## 2022-09-21 DIAGNOSIS — D125 Benign neoplasm of sigmoid colon: Secondary | ICD-10-CM | POA: Diagnosis not present

## 2022-09-21 DIAGNOSIS — K219 Gastro-esophageal reflux disease without esophagitis: Secondary | ICD-10-CM | POA: Diagnosis not present

## 2022-09-21 DIAGNOSIS — G4730 Sleep apnea, unspecified: Secondary | ICD-10-CM | POA: Insufficient documentation

## 2022-09-21 DIAGNOSIS — E785 Hyperlipidemia, unspecified: Secondary | ICD-10-CM | POA: Insufficient documentation

## 2022-09-21 DIAGNOSIS — Z1211 Encounter for screening for malignant neoplasm of colon: Secondary | ICD-10-CM | POA: Insufficient documentation

## 2022-09-21 DIAGNOSIS — K635 Polyp of colon: Secondary | ICD-10-CM | POA: Diagnosis not present

## 2022-09-21 HISTORY — PX: POLYPECTOMY: SHX5525

## 2022-09-21 HISTORY — PX: COLONOSCOPY WITH PROPOFOL: SHX5780

## 2022-09-21 HISTORY — PX: HEMOSTASIS CLIP PLACEMENT: SHX6857

## 2022-09-21 SURGERY — COLONOSCOPY WITH PROPOFOL
Anesthesia: General

## 2022-09-21 MED ORDER — PROPOFOL 10 MG/ML IV BOLUS
INTRAVENOUS | Status: DC | PRN
  Administered 2022-09-21: 100 mg via INTRAVENOUS

## 2022-09-21 MED ORDER — PROPOFOL 500 MG/50ML IV EMUL
INTRAVENOUS | Status: DC | PRN
  Administered 2022-09-21: 100 ug/kg/min via INTRAVENOUS

## 2022-09-21 MED ORDER — SODIUM CHLORIDE 0.9 % IV SOLN: INTRAVENOUS | Status: DC

## 2022-09-21 MED ORDER — PROPOFOL 1000 MG/100ML IV EMUL
INTRAVENOUS | Status: AC
  Filled 2022-09-21: qty 100

## 2022-09-21 MED ORDER — LIDOCAINE HCL (CARDIAC) PF 100 MG/5ML IV SOSY
PREFILLED_SYRINGE | INTRAVENOUS | Status: DC | PRN
  Administered 2022-09-21: 50 mg via INTRAVENOUS

## 2022-09-21 MED ORDER — LIDOCAINE HCL (PF) 2 % IJ SOLN
INTRAMUSCULAR | Status: AC
  Filled 2022-09-21: qty 5

## 2022-09-21 NOTE — Anesthesia Preprocedure Evaluation (Signed)
Anesthesia Evaluation  Patient identified by MRN, date of birth, ID band Patient awake    Reviewed: Allergy & Precautions, H&P , NPO status , Patient's Chart, lab work & pertinent test results, reviewed documented beta blocker date and time   Airway Mallampati: II   Neck ROM: full    Dental  (+) Poor Dentition   Pulmonary sleep apnea    Pulmonary exam normal        Cardiovascular hypertension, negative cardio ROS Normal cardiovascular exam Rhythm:regular Rate:Normal     Neuro/Psych negative neurological ROS  negative psych ROS   GI/Hepatic Neg liver ROS,GERD  Medicated,,  Endo/Other  negative endocrine ROS    Renal/GU negative Renal ROS  negative genitourinary   Musculoskeletal   Abdominal   Peds  Hematology negative hematology ROS (+)   Anesthesia Other Findings Past Medical History: No date: Allergy No date: Bone spur No date: Elevated PSA 03/16/2012: Hernia No date: Hyperlipidemia No date: Hypertension Past Surgical History: 2008: BACK SURGERY No date: KNEE ARTHROSCOPY; Left 4098,1191: KNEE SURGERY; Left No date: TONSILLECTOMY AND ADENOIDECTOMY   Reproductive/Obstetrics negative OB ROS                             Anesthesia Physical Anesthesia Plan  ASA: 3  Anesthesia Plan: General   Post-op Pain Management:    Induction:   PONV Risk Score and Plan:   Airway Management Planned:   Additional Equipment:   Intra-op Plan:   Post-operative Plan:   Informed Consent: I have reviewed the patients History and Physical, chart, labs and discussed the procedure including the risks, benefits and alternatives for the proposed anesthesia with the patient or authorized representative who has indicated his/her understanding and acceptance.     Dental Advisory Given  Plan Discussed with: CRNA  Anesthesia Plan Comments:        Anesthesia Quick Evaluation

## 2022-09-21 NOTE — H&P (Signed)
Wyline Mood, MD [address], [address], [place], [place], [address] [address], [address], [place], [place], [address] Phone: [phone]  Fax: [phone]  Primary Care Physician:  Smitty Cords, DO   Pre-Procedure History & Physical: HPI:  Warren Jackson is a 72 y.o. male is here for an colonoscopy.   Past Medical History:  Diagnosis Date   Allergy    Bone spur    Elevated PSA    Hernia 03/16/2012   Hyperlipidemia    Hypertension     Past Surgical History:  Procedure Laterality Date   BACK SURGERY  2008   KNEE ARTHROSCOPY Left    KNEE SURGERY Left 5784,6962   TONSILLECTOMY AND ADENOIDECTOMY      Prior to Admission medications   Medication Sig Start Date End Date Taking? Authorizing Provider  lisinopril-hydrochlorothiazide (ZESTORETIC) 10-12.5 MG tablet Take 1 tablet by mouth daily. 03/27/22  Yes Karamalegos, Netta Neat, DO  metoprolol succinate (TOPROL-XL) 25 MG 24 hr tablet Take 1 tablet (25 mg total) by mouth daily. 03/27/22  Yes Karamalegos, Netta Neat, DO  aspirin 81 MG tablet Take 81 mg by mouth daily.    [provider]  Cetirizine HCl (ZYRTEC ALLERGY PO)  04/27/22   [provider]  diphenhydrAMINE HCl, Sleep, 50 MG CAPS Take 1 capsule by mouth at bedtime as needed.    [provider]  lovastatin (MEVACOR) 40 MG tablet TAKE 1 TABLET BY MOUTH EVERYDAY AT BEDTIME 06/23/22   Karamalegos, Netta Neat, DO  methocarbamol (ROBAXIN) 500 MG tablet Take 1 tablet (500 mg total) by mouth 2 (two) times daily as needed for muscle spasms. 09/14/22   Lorre Munroe, NP  naproxen (NAPROSYN) 500 MG tablet Take 1 tablet (500 mg total) by mouth 2 (two) times daily with a meal. 09/14/22   Lorre Munroe, NP    Allergies as of 06/11/2022 - Review Complete 06/05/2022  Allergen Reaction Noted   Latex Other (See Comments) 05/13/2022   Other  09/14/2012   Penicillins Rash 08/31/2012    Family History  Problem Relation Age of Onset    Peptic Ulcer Disease Mother    Cancer Mother    Heart attack Father    Dementia Maternal Grandmother    Diabetes Maternal Grandfather    Heart attack Paternal Grandmother    CVA Paternal Grandmother    Alcohol abuse Paternal Grandfather    Healthy Daughter     Social History   Socioeconomic History   Marital status: Married    Spouse name: Not on file   Number of children: Not on file   Years of education: Not on file   Highest education level: Bachelor's degree (e.g., BA, AB, BS)  Occupational History   Not on file  Tobacco Use   Smoking status: Never   Smokeless tobacco: Never  Vaping Use   Vaping Use: Never used  Substance and Sexual Activity   Alcohol use: Yes    Comment: rarely   Drug use: No   Sexual activity: Not on file  Other Topics Concern   Not on file  Social History Narrative   Not on file   Social Determinants of Health   Financial Resource Strain: Low Risk  (09/14/2022)   Overall Financial Resource Strain (CARDIA)    Difficulty of Paying Living Expenses: Not hard at all  Food Insecurity: No Food Insecurity (09/14/2022)   Hunger Vital Sign    Worried About Running Out of Food in the  Last Year: Never true    Ran Out of Food in the Last Year: Never true  Transportation Needs: No Transportation Needs (09/14/2022)   PRAPARE - Administrator, Civil Service (Medical): No    Lack of Transportation (Non-Medical): No  Physical Activity: Sufficiently Active (09/14/2022)   Exercise Vital Sign    Days of Exercise per Week: 6 days    Minutes of Exercise per Session: 70 min  Stress: No Stress Concern Present (09/14/2022)   [employer] of Occupational Health - Occupational Stress Questionnaire    Feeling of Stress : Not at all  Social Connections: Unknown (09/14/2022)   Social Connection and Isolation Panel [NHANES]    Frequency of Communication with Friends and Family: More than three times a week    Frequency of Social Gatherings with Friends and  Family: Once a week    Attends Religious Services: Patient declined    Database administrator or Organizations: No    Attends Banker Meetings: Never    Marital Status: Married  Catering manager Violence: Not At Risk (06/05/2022)   Humiliation, Afraid, Rape, and Kick questionnaire    Fear of Current or Ex-Partner: No    Emotionally Abused: No    Physically Abused: No    Sexually Abused: No    Review of Systems: See HPI, otherwise negative ROS  Physical Exam: Pulse 74   Temp (!) 97 F (36.1 C) (Temporal)   Resp 18   Ht 5\' 9"  (1.753 m)   Wt 81.6 kg   SpO2 100%   BMI 26.58 kg/m  General:   Alert,  pleasant and cooperative in NAD Head:  Normocephalic and atraumatic. Neck:  Supple; no masses or thyromegaly. Lungs:  Clear throughout to auscultation, normal respiratory effort.    Heart:  +S1, +S2, Regular rate and rhythm, No edema. Abdomen:  Soft, nontender and nondistended. Normal bowel sounds, without guarding, and without rebound.   Neurologic:  Alert and  oriented x4;  grossly normal neurologically.  Impression/Plan: Warren Jackson is here for an colonoscopy to be performed for Screening colonoscopy average risk   Risks, benefits, limitations, and alternatives regarding  colonoscopy have been reviewed with the patient.  Questions have been answered.  All parties agreeable.   Wyline Mood, MD  09/21/2022, 7:51 AM \

## 2022-09-21 NOTE — Transfer of Care (Signed)
Immediate Anesthesia Transfer of Care Note  Patient: Warren Jackson  Procedure(s) Performed: COLONOSCOPY WITH PROPOFOL POLYPECTOMY HEMOSTASIS CLIP PLACEMENT  Patient Location: PACU  Anesthesia Type:General  Level of Consciousness: drowsy, patient cooperative, and responds to stimulation  Airway & Oxygen Therapy: Patient Spontanous Breathing  Post-op Assessment: Report given to RN and Post -op Vital signs reviewed and stable  Post vital signs: Reviewed and stable  Last Vitals:  Vitals Value Taken Time  BP 86/49 09/21/22 0827  Temp    Pulse 56   Resp 14 09/21/22 0827  SpO2 98 % 09/21/22 0827    Last Pain:  Vitals:   09/21/22 0827  TempSrc:   PainSc: Asleep         Complications: No notable events documented.

## 2022-09-21 NOTE — Op Note (Signed)
[facility]astroenterology Patient Name: Warren Jackson Procedure Date: 09/21/2022 8:01 AM MRN: [id number] Account #: [id number] Date of Birth: 10-25-1950 Admit Type: Outpatient Age: 72 Room: [facility] ENDO ROOM 1 Gender: Male Note Status: Finalized Instrument Name: Prentice Docker [id number] Procedure:             Colonoscopy Indications:           Screening for colorectal malignant neoplasm Providers:             Wyline Mood MD, MD Medicines:             Monitored Anesthesia Care Complications:         No immediate complications. Procedure:             Pre-Anesthesia Assessment:                        - Prior to the procedure, a History and Physical was                         performed, and patient medications, allergies and                         sensitivities were reviewed. The patient's tolerance                         of previous anesthesia was reviewed.                        - The risks and benefits of the procedure and the                         sedation options and risks were discussed with the                         patient. All questions were answered and informed                         consent was obtained.                        - ASA Grade Assessment: II - A patient with mild                         systemic disease.                        After obtaining informed consent, the colonoscope was                         passed under direct vision. Throughout the procedure,                         the patient's blood pressure, pulse, and oxygen                         saturations were monitored continuously. The                         Colonoscope was introduced through the anus and  advanced to the the cecum, identified by the                         appendiceal orifice. The colonoscopy was performed                         with ease. The patient tolerated the procedure well.                         The quality of the bowel preparation  was excellent.                         The ileocecal valve, appendiceal orifice, and rectum                         were photographed. Findings:      The perianal and digital rectal examinations were normal.      An 8 mm polyp was found in the sigmoid colon. The polyp was sessile. The       polyp was removed with a cold snare. Resection and retrieval were       complete. To prevent bleeding after the polypectomy, one hemostatic clip       was successfully placed. There was no bleeding at the end of the       procedure.      The exam was otherwise without abnormality on direct and retroflexion       views. Impression:            - One 8 mm polyp in the sigmoid colon, removed with a                         cold snare. Resected and retrieved. Clip was placed.                        - The examination was otherwise normal on direct and                         retroflexion views. Recommendation:        - Discharge patient to home (with escort).                        - Resume previous diet.                        - Continue present medications.                        - Await pathology results.                        - Repeat colonoscopy for surveillance based on                         pathology results. Procedure Code(s):     --- Professional ---                        [phone], Colonoscopy, flexible; with removal of  tumor(s), polyp(s), or other lesion(s) by snare                         technique Diagnosis Code(s):     --- Professional ---                        Z12.11, Encounter for screening for malignant neoplasm                         of colon                        D12.5, Benign neoplasm of sigmoid colon CPT copyright 2022 American Medical Association. All rights reserved. The codes documented in this report are preliminary and upon coder review may  be revised to meet current compliance requirements. Wyline Mood, MD Wyline Mood MD, MD 09/21/2022 8:23:09 AM This  report has been signed electronically. Number of Addenda: 0 Note Initiated On: 09/21/2022 8:01 AM Scope Withdrawal Time: 0 hours 12 minutes 47 seconds  Total Procedure Duration: 0 hours 16 minutes 19 seconds  Estimated Blood Loss:  Estimated blood loss: none.      [facility]

## 2022-09-22 ENCOUNTER — Encounter: Payer: Self-pay | Admitting: Gastroenterology

## 2022-09-24 NOTE — Anesthesia Postprocedure Evaluation (Signed)
Anesthesia Post Note  Patient: Warren Jackson  Procedure(s) Performed: COLONOSCOPY WITH PROPOFOL POLYPECTOMY HEMOSTASIS CLIP PLACEMENT  Anesthesia Type: General Anesthetic complications: no   No notable events documented.   Last Vitals:  Vitals:   09/21/22 0836 09/21/22 0847  BP: (!) 94/53 129/79  Pulse:    Resp: 14 15  Temp: (!) 35.9 C   SpO2: 96% 95%    Last Pain:  Vitals:   09/22/22 0723  TempSrc:   PainSc: 0-No pain                 Yevette Edwards

## 2022-09-24 NOTE — Anesthesia Postprocedure Evaluation (Signed)
Anesthesia Post Note  Patient: Warren Jackson  Procedure(s) Performed: COLONOSCOPY WITH PROPOFOL POLYPECTOMY HEMOSTASIS CLIP PLACEMENT  Anesthesia Type: General Anesthetic complications: no   No notable events documented.   Last Vitals:  Vitals:   09/21/22 0836 09/21/22 0847  BP: (!) 94/53 129/79  Pulse:    Resp: 14 15  Temp: (!) 35.9 C   SpO2: 96% 95%    Last Pain:  Vitals:   09/22/22 0723  TempSrc:   PainSc: 0-No pain                 Samarth Ogle G Collier Bohnet      

## 2022-10-17 DIAGNOSIS — U071 COVID-19: Secondary | ICD-10-CM | POA: Diagnosis not present

## 2022-10-17 DIAGNOSIS — Z20822 Contact with and (suspected) exposure to covid-19: Secondary | ICD-10-CM | POA: Diagnosis not present

## 2022-12-07 ENCOUNTER — Other Ambulatory Visit: Payer: Medicare PPO

## 2022-12-16 ENCOUNTER — Ambulatory Visit: Payer: Medicare PPO | Admitting: Family Medicine

## 2022-12-18 ENCOUNTER — Other Ambulatory Visit: Payer: Self-pay

## 2022-12-18 DIAGNOSIS — R7309 Other abnormal glucose: Secondary | ICD-10-CM

## 2022-12-18 DIAGNOSIS — E7800 Pure hypercholesterolemia, unspecified: Secondary | ICD-10-CM

## 2022-12-18 DIAGNOSIS — R7989 Other specified abnormal findings of blood chemistry: Secondary | ICD-10-CM

## 2022-12-18 DIAGNOSIS — N401 Enlarged prostate with lower urinary tract symptoms: Secondary | ICD-10-CM

## 2022-12-18 DIAGNOSIS — R972 Elevated prostate specific antigen [PSA]: Secondary | ICD-10-CM

## 2022-12-20 ENCOUNTER — Other Ambulatory Visit: Payer: Self-pay | Admitting: Family Medicine

## 2022-12-20 DIAGNOSIS — E7800 Pure hypercholesterolemia, unspecified: Secondary | ICD-10-CM

## 2022-12-21 ENCOUNTER — Other Ambulatory Visit: Payer: Medicare PPO

## 2022-12-21 DIAGNOSIS — R7989 Other specified abnormal findings of blood chemistry: Secondary | ICD-10-CM | POA: Diagnosis not present

## 2022-12-21 DIAGNOSIS — R972 Elevated prostate specific antigen [PSA]: Secondary | ICD-10-CM | POA: Diagnosis not present

## 2022-12-21 DIAGNOSIS — R351 Nocturia: Secondary | ICD-10-CM | POA: Diagnosis not present

## 2022-12-21 DIAGNOSIS — N401 Enlarged prostate with lower urinary tract symptoms: Secondary | ICD-10-CM | POA: Diagnosis not present

## 2022-12-21 DIAGNOSIS — R7309 Other abnormal glucose: Secondary | ICD-10-CM | POA: Diagnosis not present

## 2022-12-21 DIAGNOSIS — E7800 Pure hypercholesterolemia, unspecified: Secondary | ICD-10-CM | POA: Diagnosis not present

## 2022-12-21 NOTE — Telephone Encounter (Signed)
Requested Prescriptions  Pending Prescriptions Disp Refills   lovastatin (MEVACOR) 40 MG tablet [Pharmacy Med Name: LOVASTATIN 40 MG TABLET] 90 tablet 1    Sig: TAKE 1 TABLET BY MOUTH EVERYDAY AT BEDTIME     Cardiovascular:  Antilipid - Statins 2 Failed - 12/20/2022 10:57 AM      Failed - Lipid Panel in normal range within the last 12 months    Cholesterol, Total  Date Value Ref Range Status  03/05/2020 168 100 - 199 mg/dL Final   Cholesterol  Date Value Ref Range Status  06/01/2022 160 <200 mg/dL Final   LDL Cholesterol (Calc)  Date Value Ref Range Status  06/01/2022 92 mg/dL (calc) Final    Comment:    Reference range: <100 . Desirable range <100 mg/dL for primary prevention;   <70 mg/dL for patients with CHD or diabetic patients  with > or = 2 CHD risk factors. Marland Kitchen LDL-C is now calculated using the Martin-Hopkins  calculation, which is a validated novel method providing  better accuracy than the Friedewald equation in the  estimation of LDL-C.  Horald Pollen et al. Lenox Ahr. 1610;960(45): 2061-2068  (http://education.[employer].com/faq/FAQ164)    HDL  Date Value Ref Range Status  06/01/2022 48 > OR = 40 mg/dL Final  40/98/1191 50 >47 mg/dL Final   Triglycerides  Date Value Ref Range Status  06/01/2022 103 <150 mg/dL Final         Passed - Cr in normal range and within 360 days    Creat  Date Value Ref Range Status  06/01/2022 1.07 0.70 - 1.28 mg/dL Final         Passed - Patient is not pregnant      Passed - Valid encounter within last 12 months    Recent Outpatient Visits           3 months ago Acute right-sided thoracic back pain   [place] [facility] [place], Salvadore Oxford, NP   6 months ago Annual physical exam   [place] [facility] [place], Netta Neat, DO   7 months ago Benign prostatic hyperplasia with nocturia   [place] [facility] Smitty Cords, DO   1 year ago Annual physical  exam   [place] [facility] Smitty Cords, DO   1 year ago Benign prostatic hyperplasia with nocturia   [place] [facility] [place], Netta Neat, DO       Future Appointments             In 1 week Althea Charon, Netta Neat, DO [facility], [facility]

## 2022-12-22 LAB — T4, FREE: Free T4: 0.9 ng/dL (ref 0.8–1.8)

## 2022-12-22 LAB — PSA: PSA: 4.4 ng/mL — ABNORMAL HIGH (ref <=4.00)

## 2022-12-22 LAB — HEMOGLOBIN A1C
Hgb A1c MFr Bld: 5.9 %{Hb} — ABNORMAL HIGH (ref <5.7)
Mean Plasma Glucose: 123 mg/dL
eAG (mmol/L): 6.8 mmol/L

## 2022-12-22 LAB — TSH: TSH: 3.74 m[IU]/L (ref 0.40–4.50)

## 2022-12-29 ENCOUNTER — Ambulatory Visit: Payer: Medicare PPO | Admitting: Family Medicine

## 2022-12-29 ENCOUNTER — Encounter: Payer: Self-pay | Admitting: Family Medicine

## 2022-12-29 VITALS — BP 120/86 | HR 62 | Ht 69.0 in | Wt 177.2 lb

## 2022-12-29 DIAGNOSIS — R351 Nocturia: Secondary | ICD-10-CM

## 2022-12-29 DIAGNOSIS — R972 Elevated prostate specific antigen [PSA]: Secondary | ICD-10-CM

## 2022-12-29 DIAGNOSIS — B351 Tinea unguium: Secondary | ICD-10-CM

## 2022-12-29 DIAGNOSIS — R7989 Other specified abnormal findings of blood chemistry: Secondary | ICD-10-CM

## 2022-12-29 DIAGNOSIS — N401 Enlarged prostate with lower urinary tract symptoms: Secondary | ICD-10-CM

## 2022-12-29 DIAGNOSIS — R7309 Other abnormal glucose: Secondary | ICD-10-CM | POA: Diagnosis not present

## 2022-12-29 NOTE — Progress Notes (Unsigned)
Subjective:    Patient ID: Warren Jackson, male    DOB: 06-26-1950, 72 y.o.   MRN: [id number]  [name] is a 72 y.o. male presenting on 12/29/2022 for Hyperlipidemia and Hypertension   HPI  Elevated PSA BPH Prior diagnosis since 1991 Last PSA trend 4.35 (11/2021) > 5/23 (05/2022) Previously on [facility], limited results, discontinued No prior prostate biopsy. He does well during day. Bathroom before going for walk Not having significant problems during the day Seems to only have prostate BPH symptoms at night    ***Nocturia 1 to 3 x overnight ***Declines alpha blocker trial rx  He describes some issues with crazy dreams  History of COVID August   CHRONIC HTN: Tried off these meds previously at once. Needed BB Home BP readings none recently. Current Meds - Lisinopril-HCTZ 10-12.5mg  every other day, Metoprolol XL 25mg  daily   Reports good compliance. Tolerating well, w/o complaints. Walking daily for exercise Admits postural dizziness  Denies CP, dyspnea, HA, edema, dizziness / lightheadedness   HYPERLIPIDEMIA: - Reports concerns LDL stable 92, now < 100 - Currently taking Lovastatin 40mg , tolerating well without side effects or myalgias   Insomnia He had CPAP previously, he was taking it off and turning Had a sleep study previously and inaccurate  He takes OTC Diphenhydramine PRN as sleep aid PRN, able to fall asleep fairly well, and he can go back to sleep after waking up overnight Has tried Ambien in the past. He typically takes a nap in the afternoon, as a pattern.   Elevated A1c Last lab elevated A1c up to 5.9, prior results 5.5 to 5.7 No new concerns. He has fam history of grandparent with DM Goal to keep improving diet. He has mostly eliminated sweets snacks and increasing fruit etc Continues with exercise regimen   Elevated TSH TSH 4.53, previous 3.8 normal, and prior highest was 3 years ago at 5.0 Add Free T4 next time  ***Future trend with  following TSH + Free T4     Health Maintenance: Colonoscopy completed 6/20214, next due 10 years, 2024. Repeat colonoscopy referral placed today  Health Maintenance: ***     12/29/2022    8:45 AM 06/05/2022    2:35 PM 05/13/2022    9:34 AM  Depression screen PHQ 2/9  Decreased Interest 0 0 0  Down, Depressed, Hopeless 0 0 0  PHQ - 2 Score 0 0 0  Altered sleeping 1 0 0  Tired, decreased energy 0 0 0  Change in appetite 0 0 0  Feeling bad or failure about yourself  0 0 0  Trouble concentrating 0 0 0  Moving slowly or fidgety/restless 0 0 0  Suicidal thoughts 0 0 0  PHQ-9 Score 1 0 0  Difficult doing work/chores Not difficult at all Not difficult at all Not difficult at all    Past Medical History:  Diagnosis Date   Allergy    Bone spur    Elevated PSA    Hernia 03/16/2012   Hyperlipidemia    Hypertension    Past Surgical History:  Procedure Laterality Date   BACK SURGERY  2008   COLONOSCOPY WITH PROPOFOL N/A 09/21/2022   Procedure: COLONOSCOPY WITH PROPOFOL;  Surgeon: Wyline Mood, MD;  Location: [facility] ENDOSCOPY;  Service: Gastroenterology;  Laterality: N/A;   HEMOSTASIS CLIP PLACEMENT  09/21/2022   Procedure: HEMOSTASIS CLIP PLACEMENT;  Surgeon: Wyline Mood, MD;  Location: [facility] ENDOSCOPY;  Service: Gastroenterology;;   KNEE ARTHROSCOPY Left    KNEE  SURGERY Left [id number]   POLYPECTOMY  09/21/2022   Procedure: POLYPECTOMY;  Surgeon: Wyline Mood, MD;  Location: [facility] ENDOSCOPY;  Service: Gastroenterology;;   TONSILLECTOMY AND ADENOIDECTOMY     Social History   Socioeconomic History   Marital status: Married    Spouse name: Not on file   Number of children: Not on file   Years of education: Not on file   Highest education level: Bachelor's degree (e.g., BA, AB, BS)  Occupational History   Not on file  Tobacco Use   Smoking status: Never   Smokeless tobacco: Never  Vaping Use   Vaping status: Never Used  Substance and Sexual Activity   Alcohol use: Yes    Comment:  rarely   Drug use: No   Sexual activity: Not on file  Other Topics Concern   Not on file  Social History Narrative   Not on file   Social Determinants of Health   Financial Resource Strain: Low Risk  (12/25/2022)   Overall Financial Resource Strain (CARDIA)    Difficulty of Paying Living Expenses: Not hard at all  Food Insecurity: No Food Insecurity (12/25/2022)   Hunger Vital Sign    Worried About Running Out of Food in the Last Year: Never true    Ran Out of Food in the Last Year: Never true  Transportation Needs: Unknown (12/25/2022)   PRAPARE - Administrator, Civil Service (Medical): Patient declined    Lack of Transportation (Non-Medical): No  Physical Activity: Sufficiently Active (12/25/2022)   Exercise Vital Sign    Days of Exercise per Week: 7 days    Minutes of Exercise per Session: 70 min  Stress: No Stress Concern Present (12/25/2022)   [employer] of Occupational Health - Occupational Stress Questionnaire    Feeling of Stress : Not at all  Social Connections: Moderately Isolated (12/25/2022)   Social Connection and Isolation Panel [NHANES]    Frequency of Communication with Friends and Family: Three times a week    Frequency of Social Gatherings with Friends and Family: Once a week    Attends Religious Services: Never    Database administrator or Organizations: No    Attends Banker Meetings: Never    Marital Status: Married  Catering manager Violence: Not At Risk (06/05/2022)   Humiliation, Afraid, Rape, and Kick questionnaire    Fear of Current or Ex-Partner: No    Emotionally Abused: No    Physically Abused: No    Sexually Abused: No   Family History  Problem Relation Age of Onset   Peptic Ulcer Disease Mother    Cancer Mother    Heart attack Father    Dementia Maternal Grandmother    Diabetes Maternal Grandfather    Heart attack Paternal Grandmother    CVA Paternal Grandmother    Alcohol abuse Paternal Grandfather     Healthy Daughter    Current Outpatient Medications on File Prior to Visit  Medication Sig   aspirin 81 MG tablet Take 81 mg by mouth daily.   Cetirizine HCl (ZYRTEC ALLERGY PO)    diphenhydrAMINE HCl, Sleep, 50 MG CAPS Take 1 capsule by mouth at bedtime as needed.   lisinopril-hydrochlorothiazide (ZESTORETIC) 10-12.5 MG tablet Take 1 tablet by mouth daily.   lovastatin (MEVACOR) 40 MG tablet TAKE 1 TABLET BY MOUTH EVERYDAY AT BEDTIME   metoprolol succinate (TOPROL-XL) 25 MG 24 hr tablet Take 1 tablet (25 mg total) by mouth daily.   No current facility-administered  medications on file prior to visit.    Review of Systems Per HPI unless specifically indicated above      Objective:    BP 120/86   Pulse 62   Ht 5\' 9"  (1.753 m)   Wt 177 lb 3.2 oz (80.4 kg)   SpO2 100%   BMI 26.17 kg/m   Wt Readings from Last 3 Encounters:  12/29/22 177 lb 3.2 oz (80.4 kg)  09/21/22 180 lb (81.6 kg)  09/14/22 187 lb (84.8 kg)    Physical Exam   Results for orders placed or performed in visit on 12/18/22  PSA  Result Value Ref Range   PSA 4.40 (H) < OR = 4.00 ng/mL  T4, free  Result Value Ref Range   Free T4 0.9 0.8 - 1.8 ng/dL  TSH  Result Value Ref Range   TSH 3.74 0.40 - 4.50 mIU/L  Hemoglobin A1c  Result Value Ref Range   Hgb A1c MFr Bld 5.9 (H) <5.7 % of total Hgb   Mean Plasma Glucose 123 mg/dL   eAG (mmol/L) 6.8 mmol/L      Assessment & Plan:   Problem List Items Addressed This Visit     Benign prostatic hyperplasia with nocturia - Primary   Other Visit Diagnoses     Elevated PSA, less than 10 ng/ml       Elevated hemoglobin A1c       Elevated TSH           No orders of the defined types were placed in this encounter.     Follow up plan: Return in about 23 weeks (around 06/08/2023) for 05/31/23 845am lab then 1 week later 06/08/2023 820am physical.  ***Future 6 month PSA  Saralyn Pilar, DO [facility] 12/29/2022, 8:55 AM

## 2022-12-29 NOTE — Patient Instructions (Addendum)
Thank you for coming to the office today.  PSA 4.40 improved  Recent Labs    06/01/22 0920 12/21/22 0850  HGBA1C 5.7* 5.9*   Keep improving diet limit sugar carb starches  Thyroid panel looks good, the T4 level of your hormone is in normal range. No treatment required.  Try topical antifungal nail polish OTC generic version. 6-12 months.   DUE for FASTING BLOOD WORK (no food or drink after midnight before the lab appointment, only water or coffee without cream/sugar on the morning of)  SCHEDULE "Lab Only" visit in the morning at the clinic for lab draw in 5-6 MONTHS   - Make sure Lab Only appointment is at about 1 week before your next appointment, so that results will be available  For Lab Results, once available within 2-3 days of blood draw, you can can log in to MyChart online to view your results and a brief explanation. Also, we can discuss results at next follow-up visit.    Please schedule a Follow-up Appointment to: Return in about 23 weeks (around 06/08/2023) for 05/31/23 845am lab then 1 week later 06/08/2023 820am physical.  If you have any other questions or concerns, please feel free to call the office or send a message through MyChart. You may also schedule an earlier appointment if necessary.  Additionally, you may be receiving a survey about your experience at our office within a few days to 1 week by e-mail or mail. We value your feedback.  Saralyn Pilar, DO [facility], [place]

## 2022-12-30 ENCOUNTER — Other Ambulatory Visit: Payer: Self-pay | Admitting: Family Medicine

## 2022-12-30 DIAGNOSIS — Z0000 Encounter for general adult medical examination without abnormal findings: Secondary | ICD-10-CM

## 2022-12-30 DIAGNOSIS — R972 Elevated prostate specific antigen [PSA]: Secondary | ICD-10-CM

## 2022-12-30 DIAGNOSIS — I10 Essential (primary) hypertension: Secondary | ICD-10-CM

## 2022-12-30 DIAGNOSIS — R7309 Other abnormal glucose: Secondary | ICD-10-CM

## 2022-12-30 DIAGNOSIS — R7989 Other specified abnormal findings of blood chemistry: Secondary | ICD-10-CM

## 2022-12-30 DIAGNOSIS — E7800 Pure hypercholesterolemia, unspecified: Secondary | ICD-10-CM

## 2022-12-30 NOTE — Telephone Encounter (Signed)
Requested Prescriptions  Pending Prescriptions Disp Refills   metoprolol succinate (TOPROL-XL) 25 MG 24 hr tablet [Pharmacy Med Name: METOPROLOL SUCC ER 25 MG TAB] 90 tablet 3    Sig: TAKE 1 TABLET (25 MG TOTAL) BY MOUTH DAILY.     Cardiovascular:  Beta Blockers Passed - 12/30/2022  8:22 AM      Passed - Last BP in normal range    BP Readings from Last 1 Encounters:  12/29/22 120/86         Passed - Last Heart Rate in normal range    Pulse Readings from Last 1 Encounters:  12/29/22 62         Passed - Valid encounter within last 6 months    Recent Outpatient Visits           Yesterday Benign prostatic hyperplasia with nocturia   [facility] [place], Netta Neat, DO   3 months ago Acute right-sided thoracic back pain   [place] [facility] [place], Salvadore Oxford, NP   6 months ago Annual physical exam   [place] [facility] Smitty Cords, DO   7 months ago Benign prostatic hyperplasia with nocturia   [place] [facility] Smitty Cords, DO   1 year ago Annual physical exam    [facility] Smitty Cords, DO       Future Appointments             In 5 months Althea Charon, Netta Neat, DO  [facility], [facility]

## 2023-04-07 DIAGNOSIS — M180 Bilateral primary osteoarthritis of first carpometacarpal joints: Secondary | ICD-10-CM | POA: Diagnosis not present

## 2023-04-07 DIAGNOSIS — M79644 Pain in right finger(s): Secondary | ICD-10-CM | POA: Diagnosis not present

## 2023-04-07 DIAGNOSIS — M79645 Pain in left finger(s): Secondary | ICD-10-CM | POA: Diagnosis not present

## 2023-04-07 DIAGNOSIS — M1812 Unilateral primary osteoarthritis of first carpometacarpal joint, left hand: Secondary | ICD-10-CM | POA: Insufficient documentation

## 2023-04-21 DIAGNOSIS — Z0100 Encounter for examination of eyes and vision without abnormal findings: Secondary | ICD-10-CM | POA: Diagnosis not present

## 2023-04-21 DIAGNOSIS — H35373 Puckering of macula, bilateral: Secondary | ICD-10-CM | POA: Diagnosis not present

## 2023-05-31 ENCOUNTER — Other Ambulatory Visit: Payer: Medicare PPO

## 2023-05-31 DIAGNOSIS — E7800 Pure hypercholesterolemia, unspecified: Secondary | ICD-10-CM

## 2023-05-31 DIAGNOSIS — I10 Essential (primary) hypertension: Secondary | ICD-10-CM

## 2023-05-31 DIAGNOSIS — Z0000 Encounter for general adult medical examination without abnormal findings: Secondary | ICD-10-CM

## 2023-05-31 DIAGNOSIS — R972 Elevated prostate specific antigen [PSA]: Secondary | ICD-10-CM

## 2023-05-31 DIAGNOSIS — R7309 Other abnormal glucose: Secondary | ICD-10-CM

## 2023-05-31 DIAGNOSIS — R7989 Other specified abnormal findings of blood chemistry: Secondary | ICD-10-CM

## 2023-06-01 LAB — CBC WITH DIFFERENTIAL/PLATELET
Absolute Lymphocytes: 1491 {cells}/uL (ref 850–3900)
Absolute Monocytes: 553 {cells}/uL (ref 200–950)
Basophils Absolute: 42 {cells}/uL (ref 0–200)
Basophils Relative: 0.6 %
Eosinophils Absolute: 413 {cells}/uL (ref 15–500)
Eosinophils Relative: 5.9 %
HCT: 46.2 % (ref 38.5–50.0)
Hemoglobin: 15.4 g/dL (ref 13.2–17.1)
MCH: 29.2 pg (ref 27.0–33.0)
MCHC: 33.3 g/dL (ref 32.0–36.0)
MCV: 87.5 fL (ref 80.0–100.0)
MPV: 10.1 fL (ref 7.5–12.5)
Monocytes Relative: 7.9 %
Neutro Abs: 4501 {cells}/uL (ref 1500–7800)
Neutrophils Relative %: 64.3 %
Platelets: 231 10*3/uL (ref 140–400)
RBC: 5.28 10*6/uL (ref 4.20–5.80)
RDW: 13.5 % (ref 11.0–15.0)
Total Lymphocyte: 21.3 %
WBC: 7 10*3/uL (ref 3.8–10.8)

## 2023-06-01 LAB — T4, FREE: Free T4: 1.1 ng/dL (ref 0.8–1.8)

## 2023-06-01 LAB — COMPLETE METABOLIC PANEL WITH GFR
AG Ratio: 1.6 (calc) (ref 1.0–2.5)
ALT: 16 U/L (ref 9–46)
AST: 20 U/L (ref 10–35)
Albumin: 4.2 g/dL (ref 3.6–5.1)
Alkaline phosphatase (APISO): 57 U/L (ref 35–144)
BUN: 22 mg/dL (ref 7–25)
CO2: 28 mmol/L (ref 20–32)
Calcium: 9.4 mg/dL (ref 8.6–10.3)
Chloride: 104 mmol/L (ref 98–110)
Creat: 1 mg/dL (ref 0.70–1.28)
Globulin: 2.6 g/dL (ref 1.9–3.7)
Glucose, Bld: 95 mg/dL (ref 65–99)
Potassium: 4.2 mmol/L (ref 3.5–5.3)
Sodium: 141 mmol/L (ref 135–146)
Total Bilirubin: 0.8 mg/dL (ref 0.2–1.2)
Total Protein: 6.8 g/dL (ref 6.1–8.1)
eGFR: 80 mL/min/{1.73_m2} (ref >=60)

## 2023-06-01 LAB — LIPID PANEL
Cholesterol: 172 mg/dL (ref <200)
HDL: 48 mg/dL (ref >=40)
LDL Cholesterol (Calc): 104 mg/dL — ABNORMAL HIGH
Non-HDL Cholesterol (Calc): 124 mg/dL (ref <130)
Total CHOL/HDL Ratio: 3.6 (calc) (ref <5.0)
Triglycerides: 105 mg/dL (ref <150)

## 2023-06-01 LAB — HEMOGLOBIN A1C
Hgb A1c MFr Bld: 5.7 %{Hb} — ABNORMAL HIGH (ref <5.7)
Mean Plasma Glucose: 117 mg/dL
eAG (mmol/L): 6.5 mmol/L

## 2023-06-01 LAB — PSA: PSA: 5.23 ng/mL — ABNORMAL HIGH (ref <=4.00)

## 2023-06-01 LAB — TSH: TSH: 4.71 m[IU]/L — ABNORMAL HIGH (ref 0.40–4.50)

## 2023-06-03 ENCOUNTER — Encounter: Payer: Self-pay | Admitting: Family Medicine

## 2023-06-08 ENCOUNTER — Other Ambulatory Visit: Payer: Self-pay | Admitting: Family Medicine

## 2023-06-08 ENCOUNTER — Ambulatory Visit (INDEPENDENT_AMBULATORY_CARE_PROVIDER_SITE_OTHER): Payer: Medicare PPO | Admitting: Family Medicine

## 2023-06-08 ENCOUNTER — Encounter: Payer: Self-pay | Admitting: Family Medicine

## 2023-06-08 VITALS — BP 122/70 | HR 72 | Ht 69.0 in | Wt 173.0 lb

## 2023-06-08 DIAGNOSIS — R7989 Other specified abnormal findings of blood chemistry: Secondary | ICD-10-CM

## 2023-06-08 DIAGNOSIS — E7800 Pure hypercholesterolemia, unspecified: Secondary | ICD-10-CM

## 2023-06-08 DIAGNOSIS — R972 Elevated prostate specific antigen [PSA]: Secondary | ICD-10-CM

## 2023-06-08 DIAGNOSIS — Z0000 Encounter for general adult medical examination without abnormal findings: Secondary | ICD-10-CM | POA: Diagnosis not present

## 2023-06-08 DIAGNOSIS — N401 Enlarged prostate with lower urinary tract symptoms: Secondary | ICD-10-CM

## 2023-06-08 DIAGNOSIS — R7309 Other abnormal glucose: Secondary | ICD-10-CM

## 2023-06-08 DIAGNOSIS — B351 Tinea unguium: Secondary | ICD-10-CM

## 2023-06-08 DIAGNOSIS — R351 Nocturia: Secondary | ICD-10-CM

## 2023-06-08 DIAGNOSIS — I10 Essential (primary) hypertension: Secondary | ICD-10-CM

## 2023-06-08 NOTE — Patient Instructions (Addendum)
 Thank you for coming to the office today.  Labs look good overall  Referral to Dr Al Corpus [place] for the Toenail.  You have been referred for a Coronary Calcium Score Cardiac CT Scan. This is a screening test for patients aged 72-50+ with cardiovascular risk factors or who are healthy but would be interested in Cardiovascular Screening for heart disease. Even if there is a family history of heart disease, this imaging can be useful. Typically it can be done every 5+ years or at a different timeline we agree on  The scan will look at the chest and mainly focus on the heart and identify early signs of calcium build up or blockages within the heart arteries. It is not 100% accurate for identifying blockages or heart disease, but it is useful to help [place] predict who may have some early changes or be at risk in the future for a heart attack or cardiovascular problem.  The results are reviewed by a Cardiologist and they will document the results. It should become available on MyChart. Typically the results are divided into percentiles based on other patients of the same demographic and age. So it will compare your risk to others similar to you. If you have a higher score >99 or higher percentile >75%tile, it is recommended to consider Statin cholesterol therapy and or referral to Cardiologist. I will try to help explain your results and if we have questions we can contact the Cardiologist.  You will be contacted for scheduling. Usually it is done at any imaging facility through [facility], [facility] or [facility].  The cost is $99 flat fee total and it does not go through insurance, so no authorization is required.  DUE for FASTING BLOOD WORK (no food or drink after midnight before the lab appointment, only water or coffee without cream/sugar on the morning of)  SCHEDULE "Lab Only" visit in the morning at the clinic for lab draw in 6 MONTHS   - Make sure Lab Only  appointment is at about 1 week before your next appointment, so that results will be available  For Lab Results, once available within 2-3 days of blood draw, you can can log in to MyChart online to view your results and a brief explanation. Also, we can discuss results at next follow-up visit.     Please schedule a Follow-up Appointment to: Return in about 6 months (around 12/09/2023) for 6 month fasting lab then 1 week later Follow up.  If you have any other questions or concerns, please feel free to call the office or send a message through MyChart. You may also schedule an earlier appointment if necessary.  Additionally, you may be receiving a survey about your experience at our office within a few days to 1 week by e-mail or mail. We value your feedback.  Saralyn Pilar, DO [facility], [place]

## 2023-06-08 NOTE — Progress Notes (Signed)
 Subjective:    Patient ID: Warren Jackson, male    DOB: [birth year], 73 y.o.   MRN: [id number]  Warren Jackson is a 73 y.o. male presenting on 06/08/2023 for Annual Exam   HPI  Discussed the use of AI scribe software for clinical note transcription with the patient, who gave verbal consent to proceed.  History of Present Illness   Warren Jackson is a 73 year old male who presents with fluctuating PSA levels.  Left Hand Ar[facility]riis [facility] Followed by Dr Glenna Fellows [facility] He is scheduled for surgery on April 4th to address a condition in his left hand involving the [facility] joint, which has been causing discomfort. Recovery could take up to a year, with initial immobilization followed by gradual rehabilitation.  Left Great Toenail, Onychomycosis He has a fungal infection on his toenail, which has caused the nail to partially detach. He has been managing it with over-the-counter treatments but is considering seeing a podiatrist for further management. Request referral to Dr [facility]  He discusses his weight loss journey, having reduced his weight from 215 pounds to 173 pounds over the years through increased physical activity, including walking 20 to 30 miles a week. He attributes some recent weight loss to an illness he contracted in January, which caused him to lose 10 pounds over three weeks.      Elevated PSA BPH Prior diagnosis since 1991 Last PSA trend 5.23 (05/2023) prior trend 4-5.23 in past He has been experiencing fluctuating PSA levels, with values oscillating between 4 and 5. Despite these fluctuations, there have been no significant changes in urinary habits.  Previously on [employer], limited results, discontinued Not on medication No prior prostate biopsy. He does well during day Seems to only have prostate BPH symptoms at night Nocturia 1 to 3 x overnight    CHRONIC HTN: Current Meds - Lisinopril-HCTZ 10-12.5mg  every other day, Metoprolol XL 25mg  daily   Reports  good compliance. Tolerating well, w/o complaints. Walking daily for exercise Admits postural dizziness  Denies CP, dyspnea, HA, edema, dizziness / lightheadedness   HYPERLIPIDEMIA: LDL 104 Not on statin   Insomnia He had CPAP previously, he was taking it off and turning Had a sleep study previously and inaccurate  He takes OTC Diphenhydramine PRN as sleep aid PRN, able to fall asleep fairly well, and he can go back to sleep after waking up overnight Has tried Ambien in the past. He typically takes a nap in the afternoon, as a pattern.   Elevated A1c Last lab A1c 5.7, improved No new concerns. He has fam history of grandparent with DM Goal to keep improving diet. He has mostly eliminated sweets snacks and increasing fruit etc Continues with exercise regimen   Elevated TSH He has a history of thyroid issues, with a slightly elevated TSH level noted two to four years ago. Recent lab results show a TSH level of 4.7, slightly above the normal range, but his T4 levels are normal. Previous TSH levels have also been slightly elevated, but he has not experienced any symptoms related to thyroid dysfunction. Not on therapy   Health Maintenance: Updated     06/08/2023    9:17 AM 12/29/2022    8:45 AM 06/05/2022    2:35 PM  Depression screen PHQ 2/9  Decreased Interest 0 0 0  Down, Depressed, Hopeless 0 0 0  PHQ - 2 Score 0 0 0  Altered sleeping 1 1 0  Tired, decreased energy 0  0 0  Change in appetite 0 0 0  Feeling bad or failure about yourself  0 0 0  Trouble concentrating 0 0 0  Moving slowly or fidgety/restless 0 0 0  Suicidal thoughts 0 0 0  PHQ-9 Score 1 1 0  Difficult doing work/chores Not difficult at all Not difficult at all Not difficult at all       06/08/2023    9:17 AM 12/29/2022    8:45 AM 05/13/2022    9:34 AM  GAD 7 : Generalized Anxiety Score  Nervous, Anxious, on Edge 0 0 0  Control/stop worrying 0 0 0  Worry too much - different things 0 0 0  Trouble  relaxing 0 0 0  Restless 0 0 0  Easily annoyed or irritable 0 0 0  Afraid - awful might happen 0 0 0  Total GAD 7 Score 0 0 0  Anxiety Difficulty   Not difficult at all     Past Medical History:  Diagnosis Date   Allergy    Bone spur    Elevated PSA    Hernia 03/16/2012   Hyperlipidemia    Hypertension    Past Surgical History:  Procedure Laterality Date   BACK SURGERY  2008   COLONOSCOPY WITH PROPOFOL N/A 73/10/2022   Procedure: COLONOSCOPY WITH PROPOFOL;  Surgeon: Wyline Mood, MD;  Location: [facility] ENDOSCOPY;  Service: Gastroenterology;  Laterality: N/A;   HEMOSTASIS CLIP PLACEMENT  73/10/2022   Procedure: HEMOSTASIS CLIP PLACEMENT;  Surgeon: Wyline Mood, MD;  Location: [facility] ENDOSCOPY;  Service: Gastroenterology;;   KNEE ARTHROSCOPY Left    KNEE SURGERY Left [id number]   POLYPECTOMY  72/10/2022   Procedure: POLYPECTOMY;  Surgeon: Wyline Mood, MD;  Location: [facility] ENDOSCOPY;  Service: Gastroenterology;;   TONSILLECTOMY AND ADENOIDECTOMY     Social History   Socioeconomic History   Marital status: Married    Spouse name: Not on file   Number of children: Not on file   Years of education: Not on file   Highest education level: Bachelor's degree (e.g., BA, AB, BS)  Occupational History   Not on file  Tobacco Use   Smoking status: Never   Smokeless tobacco: Never  Vaping Use   Vaping status: Never Used  Substance and Sexual Activity   Alcohol use: Yes    Comment: rarely   Drug use: No   Sexual activity: Not on file  Other Topics Concern   Not on file  Social History Narrative   Not on file   Social Drivers of Health   Financial Resource Strain: Low Risk  (06/04/2023)   Overall Financial Resource Strain (CARDIA)    Difficulty of Paying Living Expenses: Not hard at all  Food Insecurity: No Food Insecurity (06/04/2023)   Hunger Vital Sign    Worried About Running Out of Food in the Last Year: Never true    Ran Out of Food in the Last Year: Never true  Transportation  Needs: No Transportation Needs (06/04/2023)   PRAPARE - Administrator, Civil Service (Medical): No    Lack of Transportation (Non-Medical): No  Physical Activity: Sufficiently Active (06/04/2023)   Exercise Vital Sign    Days of Exercise per Week: 6 days    Minutes of Exercise per Session: 70 min  Stress: No Stress Concern Present (06/04/2023)   [employer] of Occupational Health - Occupational Stress Questionnaire    Feeling of Stress : Not at all  Social Connections: Unknown (06/04/2023)   Social  Connection and Isolation Panel [NHANES]    Frequency of Communication with Friends and Family: More than three times a week    Frequency of Social Gatherings with Friends and Family: Patient declined    Attends Religious Services: Patient declined    Database administrator or Organizations: No    Attends Engineer, structural: Not on file    Marital Status: Married  Catering manager Violence: Not At Risk (06/05/2022)   Humiliation, Afraid, Rape, and Kick questionnaire    Fear of Current or Ex-Partner: No    Emotionally Abused: No    Physically Abused: No    Sexually Abused: No   Family History  Problem Relation Age of Onset   Peptic Ulcer Disease Mother    Cancer Mother    Heart attack Father    Dementia Maternal Grandmother    Diabetes Maternal Grandfather    Heart attack Paternal Grandmother    CVA Paternal Grandmother    Alcohol abuse Paternal Grandfather    Healthy Daughter    Current Outpatient Medications on File Prior to Visit  Medication Sig   aspirin 81 MG tablet Take 81 mg by mouth daily.   Cetirizine HCl (ZYRTEC ALLERGY PO)    diphenhydrAMINE HCl, Sleep, 50 MG CAPS Take 1 capsule by mouth at bedtime as needed.   lisinopril-hydrochlorothiazide (ZESTORETIC) 10-12.5 MG tablet Take 1 tablet by mouth daily.   lovastatin (MEVACOR) 40 MG tablet TAKE 1 TABLET BY MOUTH EVERYDAY AT BEDTIME   metoprolol succinate (TOPROL-XL) 25 MG 24 hr tablet TAKE 1 TABLET  (25 MG TOTAL) BY MOUTH DAILY.   No current facility-administered medications on file prior to visit.    Review of Systems  Constitutional:  Negative for activity change, appetite change, chills, diaphoresis, fatigue and fever.  HENT:  Negative for congestion and hearing loss.   Eyes:  Negative for visual disturbance.  Respiratory:  Negative for cough, chest tightness, shortness of breath and wheezing.   Cardiovascular:  Negative for chest pain, palpitations and leg swelling.  Gastrointestinal:  Negative for abdominal pain, constipation, diarrhea, nausea and vomiting.  Genitourinary:  Negative for dysuria, frequency and hematuria.  Musculoskeletal:  Negative for arthralgias and neck pain.  Skin:  Negative for rash.  Neurological:  Negative for dizziness, weakness, light-headedness, numbness and headaches.  Hematological:  Negative for adenopathy.  Psychiatric/Behavioral:  Negative for behavioral problems, dysphoric mood and sleep disturbance.    Per HPI unless specifically indicated above     Objective:    BP 122/70 (BP Location: Left Arm, Cuff Size: Normal)   Pulse 72   Ht 5\' 9"  (1.753 m)   Wt 173 lb (78.5 kg)   SpO2 98%   BMI 25.55 kg/m   Wt Readings from Last 3 Encounters:  06/08/23 173 lb (78.5 kg)  12/29/22 177 lb 3.2 oz (80.4 kg)  09/21/22 180 lb (81.6 kg)    Physical Exam Vitals and nursing note reviewed.  Constitutional:      General: He is not in acute distress.    Appearance: He is well-developed. He is not diaphoretic.     Comments: Well-appearing, comfortable, cooperative  HENT:     Head: Normocephalic and atraumatic.  Eyes:     General:        Right eye: No discharge.        Left eye: No discharge.     Conjunctiva/sclera: Conjunctivae normal.     Pupils: Pupils are equal, round, and reactive to light.  Neck:  Thyroid: No thyromegaly.  Cardiovascular:     Rate and Rhythm: Normal rate and regular rhythm.     Pulses: Normal pulses.     Heart sounds:  Normal heart sounds. No murmur heard. Pulmonary:     Effort: Pulmonary effort is normal. No respiratory distress.     Breath sounds: Normal breath sounds. No wheezing or rales.  Abdominal:     General: Bowel sounds are normal. There is no distension.     Palpations: Abdomen is soft. There is no mass.     Tenderness: There is no abdominal tenderness.  Musculoskeletal:        General: No tenderness. Normal range of motion.     Cervical back: Normal range of motion and neck supple.     Comments: Upper / Lower Extremities: - Normal muscle tone, strength bilateral upper extremities 5/5, lower extremities 5/5  Lymphadenopathy:     Cervical: No cervical adenopathy.  Skin:    General: Skin is warm and dry.     Findings: Lesion (Left great toenail thickened brittle discoloration, elevation lifting up) present. No erythema or rash.  Neurological:     Mental Status: He is alert and oriented to person, place, and time.     Comments: Distal sensation intact to light touch all extremities  Psychiatric:        Mood and Affect: Mood normal.        Behavior: Behavior normal.        Thought Content: Thought content normal.     Comments: Well groomed, good eye contact, normal speech and thoughts     Results for orders placed or performed in visit on 05/31/23  T4, free   Collection Time: 05/31/23  8:40 AM  Result Value Ref Range   Free T4 1.1 0.8 - 1.8 ng/dL  TSH   Collection Time: 05/31/23  8:40 AM  Result Value Ref Range   TSH 4.71 (H) 0.40 - 4.50 mIU/L  PSA   Collection Time: 05/31/23  8:40 AM  Result Value Ref Range   PSA 5.23 (H) < OR = 4.00 ng/mL  CBC with Differential/Platelet   Collection Time: 05/31/23  8:40 AM  Result Value Ref Range   WBC 7.0 3.8 - 10.8 Thousand/uL   RBC 5.28 4.20 - 5.80 Million/uL   Hemoglobin 15.4 13.2 - 17.1 g/dL   HCT 19.1 47.8 - 29.5 %   MCV 87.5 80.0 - 100.0 fL   MCH 29.2 27.0 - 33.0 pg   MCHC 33.3 32.0 - 36.0 g/dL   RDW 62.1 30.8 - 65.7 %    Platelets 231 140 - 400 Thousand/uL   MPV 10.1 7.5 - 12.5 fL   Neutro Abs 4,501 1,500 - 7,800 cells/uL   Absolute Lymphocytes 1,491 850 - 3,900 cells/uL   Absolute Monocytes 553 200 - 950 cells/uL   Eosinophils Absolute 413 15 - 500 cells/uL   Basophils Absolute 42 0 - 200 cells/uL   Neutrophils Relative % 64.3 %   Total Lymphocyte 21.3 %   Monocytes Relative 7.9 %   Eosinophils Relative 5.9 %   Basophils Relative 0.6 %  COMPLETE METABOLIC PANEL WITH GFR   Collection Time: 05/31/23  8:40 AM  Result Value Ref Range   Glucose, Bld 95 65 - 99 mg/dL   BUN 22 7 - 25 mg/dL   Creat 8.46 9.62 - 9.52 mg/dL   eGFR 80 > OR = 60 WU/XLK/4.40N0   BUN/Creatinine Ratio SEE NOTE: 6 - 22 (calc)   Sodium  141 135 - 146 mmol/L   Potassium 4.2 3.5 - 5.3 mmol/L   Chloride 104 98 - 110 mmol/L   CO2 28 20 - 32 mmol/L   Calcium 9.4 8.6 - 10.3 mg/dL   Total Protein 6.8 6.1 - 8.1 g/dL   Albumin 4.2 3.6 - 5.1 g/dL   Globulin 2.6 1.9 - 3.7 g/dL (calc)   AG Ratio 1.6 1.0 - 2.5 (calc)   Total Bilirubin 0.8 0.2 - 1.2 mg/dL   Alkaline phosphatase (APISO) 57 35 - 144 U/L   AST 20 10 - 35 U/L   ALT 16 9 - 46 U/L  Lipid panel   Collection Time: 05/31/23  8:40 AM  Result Value Ref Range   Cholesterol 172 <200 mg/dL   HDL 48 > OR = 40 mg/dL   Triglycerides 191 <478 mg/dL   LDL Cholesterol (Calc) 104 (H) mg/dL (calc)   Total CHOL/HDL Ratio 3.6 <5.0 (calc)   Non-HDL Cholesterol (Calc) 124 <130 mg/dL (calc)  Hemoglobin G9F   Collection Time: 05/31/23  8:40 AM  Result Value Ref Range   Hgb A1c MFr Bld 5.7 (H) <5.7 % of total Hgb   Mean Plasma Glucose 117 mg/dL   eAG (mmol/L) 6.5 mmol/L      Assessment & Plan:   Problem List Items Addressed This Visit     Benign prostatic hyperplasia with nocturia   Essential (primary) hypertension   Relevant Orders   CT CARDIAC SCORING (SELF PAY ONLY)   Other Visit Diagnoses       Annual physical exam    -  Primary     Elevated LDL cholesterol level        Relevant Orders   CT CARDIAC SCORING (SELF PAY ONLY)     Elevated TSH         Elevated hemoglobin A1c         Elevated PSA, less than 10 ng/ml         Onychomycosis of left great toe       Relevant Orders   Ambulatory referral to Podiatry        Updated Health Maintenance information Reviewed recent lab results with patient Encouraged improvement to lifestyle with diet and exercise Goal of weight loss   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 Recovery expected up to a year with gradual return to function. - Proceed with scheduled [facility] joint surgery on June 18, 2023.  Elevated Prostate-Specific Antigen (PSA)  Last lab PSA 5.23 again. PSA levels oscillating between 4 and 5 ng/mL. Enlarged prostate since 1991. No current evidence of prostate cancer. Monitoring necessary. Discussion again on risk / benefits of screening and further work up - Monitor PSA levels every 6 months - Consider urology referral if PSA exceeds 6 ng/mL or shows consistent upward trend.  Thyroid Stimulating Hormone (TSH) elevation TSH level slightly elevated at 4.7 mIU/L with normal T4 levels. No clinical symptoms of hypothyroidism. Monitoring required. - Monitor TSH and T4 levels.  Prediabetes Hemoglobin A1c at 5.7%, indicating borderline prediabetes. No immediate intervention required. - Continue monitoring A1c levels. - Encourage continued lifestyle modifications.  Cardiovascular health Cholesterol levels well-managed. Agreed to cardiac CT scan for coronary artery calcium score. Explained scan details and potential follow-up actions. - Order cardiac CT scan for coronary artery calcium score.  Onychomycosis with nail detachment Fungal infection of the toenail with partial nail detachment. Advised podiatry consultation for potential nail removal. -  Refer to podiatry for evaluation and management of onychomycosis and nail  detachment.  General Health Maintenance Maintains healthy lifestyle with regular exercise and balanced diet. Weight decreased from 215 lbs to 173 lbs. Blood pressure well-controlled. - Continue current exercise and dietary habits. - Monitor blood pressure and adjust medications as needed.  Follow-up Scheduled follow-up for blood work and consultation in September 2025. - Schedule blood work on December 06, 2023. - Schedule follow-up consultation on December 10, 2023.        Orders Placed This Encounter  Procedures   CT CARDIAC SCORING (SELF PAY ONLY)    Standing Status:   Future    Expiration Date:   06/07/2024    Preferred imaging location?:   [facility]   Ambulatory referral to Podiatry    Referral Priority:   Routine    Referral Type:   Consultation    Referral Reason:   Specialty Services Required    Requested Specialty:   Podiatry    Number of Visits Requested:   1    No orders of the defined types were placed in this encounter.    Follow up plan: Return in about 6 months (around 12/09/2023) for 6 month fasting lab then 1 week later Follow up.  Labs 12/06/23 A1c TSH T4 PSA  Saralyn Pilar, DO [facility] 06/08/2023, 8:32 AM

## 2023-06-11 ENCOUNTER — Ambulatory Visit (INDEPENDENT_AMBULATORY_CARE_PROVIDER_SITE_OTHER): Payer: Medicare PPO

## 2023-06-11 VITALS — BP 120/78 | Ht 69.0 in | Wt 174.4 lb

## 2023-06-11 DIAGNOSIS — Z0000 Encounter for general adult medical examination without abnormal findings: Secondary | ICD-10-CM | POA: Diagnosis not present

## 2023-06-11 NOTE — Progress Notes (Signed)
 Subjective:   Warren Jackson is a 73 y.o. who presents for a Medicare Wellness preventive visit.  Visit Complete: In person  Persons Participating in Visit: Patient.  AWV Questionnaire: No: Patient Medicare AWV questionnaire was not completed prior to this visit.  Cardiac Risk Factors include: advanced age (>43men, >74 women);dyslipidemia;male gender;hypertension     Objective:    Today's Vitals   06/11/23 1429 06/11/23 1434  BP: 120/78   Weight: 174 lb 6.4 oz (79.1 kg)   Height: 5\' 9"  (1.753 m)   PainSc:  4    Body mass index is 25.75 kg/m.     06/11/2023    2:42 PM 09/21/2022    7:37 AM 06/05/2022    2:37 PM 05/30/2021    2:07 PM  Advanced Directives  Does Patient Have a Medical Advance Directive? No Yes No Yes  Type of Aeronautical engineer of [place];Living will  Does patient want to make changes to medical advance directive?    Yes (Inpatient - patient defers changing a medical advance directive and declines information at this time)  Copy of Healthcare Power of Attorney in Chart?    No - copy requested  Would patient like information on creating a medical advance directive? No - Patient declined  No - Patient declined     Current Medications (verified) Outpatient Encounter Medications as of 06/11/2023  Medication Sig   aspirin 81 MG tablet Take 81 mg by mouth daily.   Cetirizine HCl (ZYRTEC ALLERGY PO)    diphenhydrAMINE HCl, Sleep, 50 MG CAPS Take 1 capsule by mouth at bedtime as needed.   lisinopril-hydrochlorothiazide (ZESTORETIC) 10-12.5 MG tablet Take 1 tablet by mouth daily.   lovastatin (MEVACOR) 40 MG tablet TAKE 1 TABLET BY MOUTH EVERYDAY AT BEDTIME   metoprolol succinate (TOPROL-XL) 25 MG 24 hr tablet TAKE 1 TABLET (25 MG TOTAL) BY MOUTH DAILY.   No facility-administered encounter medications on file as of 06/11/2023.    Allergies (verified) Latex, Other, and Penicillins   History: Past Medical History:  Diagnosis Date   Allergy     Bone spur    Elevated PSA    Hernia 03/16/2012   Hyperlipidemia    Hypertension    Past Surgical History:  Procedure Laterality Date   BACK SURGERY  2008   COLONOSCOPY WITH PROPOFOL N/A 09/21/2022   Procedure: COLONOSCOPY WITH PROPOFOL;  Surgeon: Wyline Mood, MD;  Location: [facility] ENDOSCOPY;  Service: Gastroenterology;  Laterality: N/A;   HEMOSTASIS CLIP PLACEMENT  09/21/2022   Procedure: HEMOSTASIS CLIP PLACEMENT;  Surgeon: Wyline Mood, MD;  Location: [facility] ENDOSCOPY;  Service: Gastroenterology;;   KNEE ARTHROSCOPY Left    KNEE SURGERY Left [phone]   POLYPECTOMY  09/21/2022   Procedure: POLYPECTOMY;  Surgeon: Wyline Mood, MD;  Location: [facility] ENDOSCOPY;  Service: Gastroenterology;;   TONSILLECTOMY AND ADENOIDECTOMY     Family History  Problem Relation Age of Onset   Peptic Ulcer Disease Mother    Cancer Mother    Heart attack Father    Dementia Maternal Grandmother    Diabetes Maternal Grandfather    Heart attack Paternal Grandmother    CVA Paternal Grandmother    Alcohol abuse Paternal Grandfather    Healthy Daughter    Social History   Socioeconomic History   Marital status: Married    Spouse name: Not on file   Number of children: Not on file   Years of education: Not on file   Highest education level: Bachelor's degree (  e.g., BA, AB, BS)  Occupational History   Not on file  Tobacco Use   Smoking status: Never   Smokeless tobacco: Never  Vaping Use   Vaping status: Never Used  Substance and Sexual Activity   Alcohol use: Yes    Comment: rarely   Drug use: No   Sexual activity: Not on file  Other Topics Concern   Not on file  Social History Narrative   Not on file   Social Drivers of Health   Financial Resource Strain: Low Risk  (06/11/2023)   Overall Financial Resource Strain (CARDIA)    Difficulty of Paying Living Expenses: Not hard at all  Food Insecurity: No Food Insecurity (06/11/2023)   Hunger Vital Sign    Worried About Running Out of Food in the Last  Year: Never true    Ran Out of Food in the Last Year: Never true  Transportation Needs: No Transportation Needs (06/11/2023)   PRAPARE - Administrator, Civil Service (Medical): No    Lack of Transportation (Non-Medical): No  Physical Activity: Sufficiently Active (06/11/2023)   Exercise Vital Sign    Days of Exercise per Week: 6 days    Minutes of Exercise per Session: 70 min  Stress: No Stress Concern Present (06/11/2023)   [employer] of Occupational Health - Occupational Stress Questionnaire    Feeling of Stress : Not at all  Social Connections: Unknown (06/11/2023)   Social Connection and Isolation Panel [NHANES]    Frequency of Communication with Friends and Family: More than three times a week    Frequency of Social Gatherings with Friends and Family: Patient declined    Attends Religious Services: Patient declined    Database administrator or Organizations: No    Attends Engineer, structural: Never    Marital Status: Married    Tobacco Counseling Counseling given: Not Answered    Clinical Intake:  Pre-visit preparation completed: Yes  Pain : 0-10 Pain Score: 4  Pain Type: Chronic pain Pain Location: Hand Pain Orientation: Left Pain Descriptors / Indicators: Aching, Constant Pain Onset: More than a month ago Pain Frequency: Constant Pain Relieving Factors: GETTING READY TO HAVE SURGERY ON HAND  Pain Relieving Factors: GETTING READY TO HAVE SURGERY ON HAND  BMI - recorded: 25.75 Nutritional Status: BMI 25 -29 Overweight Nutritional Risks: None Diabetes: No  Lab Results  Component Value Date   HGBA1C 5.7 (H) 05/31/2023   HGBA1C 5.9 (H) 12/21/2022   HGBA1C 5.7 (H) 06/01/2022     How often do you need to have someone help you when you read instructions, pamphlets, or other written materials from your doctor or pharmacy?: 1 - Never  Interpreter Needed?: No  Information entered by :: Kennedy Bucker, LPN   Activities of Daily Living      06/11/2023    2:44 PM 06/07/2023    1:37 PM  In your present state of health, do you have any difficulty performing the following activities:  Hearing? 0 0  Vision? 0 0  Difficulty concentrating or making decisions? 0 0  Walking or climbing stairs? 0 0  Dressing or bathing? 0 0  Doing errands, shopping? 0 0  Preparing Food and eating ? N N  Using the Toilet? N N  In the past six months, have you accidently leaked urine? N N  Do you have problems with loss of bowel control? N N  Managing your Medications? N N  Managing your Finances? N N  Housekeeping or managing your Housekeeping? N N    Patient Care Team: Smitty Cords, DO as PCP - General (Family Medicine) Lemar Livings Merrily Pew, MD (General Surgery) [place] Crane, MD as Consulting Physician (Ophthalmology)  Indicate any recent Medical Services you may have received from other than [facility] providers in the past year (date may be approximate).     Assessment:   This is a routine wellness examination for [place].  Hearing/Vision screen Hearing Screening - Comments:: NO AIDS Vision Screening - Comments:: WEARS GLASSES FOR DISTANCE- DR.BRADLEY KING   Goals Addressed             This Visit's Progress    Cut out extra servings         Depression Screen     06/11/2023    2:39 PM 06/08/2023    9:17 AM 12/29/2022    8:45 AM 06/05/2022    2:35 PM 05/13/2022    9:34 AM 05/30/2021    2:04 PM 03/05/2020    1:44 PM  PHQ 2/9 Scores  PHQ - 2 Score 0 0 0 0 0 0 0  PHQ- 9 Score 0 1 1 0 0  1    Fall Risk     06/11/2023    2:43 PM 06/08/2023    9:17 AM 06/07/2023    1:37 PM 12/29/2022    8:45 AM 06/05/2022    2:37 PM  Fall Risk   Falls in the past year? 0 0 0 0 0  Number falls in past yr: 0  0  0  Injury with Fall? 0  0 0 0  Risk for fall due to : No Fall Risks    No Fall Risks  Follow up Falls prevention discussed;Falls evaluation completed    Falls prevention discussed;Falls evaluation completed    MEDICARE  RISK AT HOME: Medicare Risk at Home Any stairs in or around the home?: Yes If so, are there any without handrails?: No Home free of loose throw rugs in walkways, pet beds, electrical cords, etc?: Yes Adequate lighting in your home to reduce risk of falls?: Yes Life alert?: No Use of a cane, walker or w/c?: No Grab bars in the bathroom?: Yes Shower chair or bench in shower?: Yes Elevated toilet seat or a handicapped toilet?: Yes  TIMED UP AND GO:  Was the test performed?  Yes  Length of time to ambulate 10 feet: 4 sec Gait steady and fast without use of assistive device  Cognitive Function: 6CIT completed        06/11/2023    2:45 PM 06/05/2022    2:45 PM  6CIT Screen  What Year? 0 points 0 points  What month? 0 points 0 points  What time? 0 points 0 points  Count back from 20 0 points 0 points  Months in reverse 0 points 0 points  Repeat phrase 0 points 0 points  Total Score 0 points 0 points    Immunizations Immunization History  Administered Date(s) Administered   Fluad Quad(high Dose 65+) 12/30/2021   Influenza Split 12/18/2011   Influenza, High Dose Seasonal PF 11/23/2017, 11/03/2018, 12/01/2019, 12/25/2020, 12/25/2022   Influenza-Unspecified 11/26/2016, 11/03/2018, 12/01/2019   PFIZER(Purple Top)SARS-COV-2 Vaccination 05/09/2019, 05/30/2019, 01/04/2020   Pfizer Covid-19 Vaccine Bivalent Booster 75yrs & up 01/02/2021, 03/31/2022   Pneumococcal Conjugate-13 02/25/2017, 11/03/2018   Pneumococcal Polysaccharide-23 02/05/2021   Respiratory Syncytial Virus Vaccine,Recomb Aduvanted(Arexvy) 01/20/2022   Rsv, Bivalent, Protein Subunit Rsvpref,pf Verdis Frederickson) 01/20/2022   Tdap 02/25/2017   Zoster Recombinant(Shingrix)  10/30/2017, 11/10/2018   Zoster, Live 05/17/2012    Screening Tests Health Maintenance  Topic Date Due   COVID-19 Vaccine (6 - 2024-25 season) 06/24/2023 (Originally 11/15/2022)   Medicare Annual Wellness (AWV)  06/10/2024   DTaP/Tdap/Td (2 - Td or Tdap)  02/26/2027   Colonoscopy  09/20/2032   Pneumonia Vaccine 77+ Years old  Completed   INFLUENZA VACCINE  Completed   Hepatitis C Screening  Completed   Zoster Vaccines- Shingrix  Completed   HPV VACCINES  Aged Out    Health Maintenance  There are no preventive care reminders to display for this patient. Health Maintenance Items Addressed: UP TO DATE SHOTS, COLONOSCOPY  Additional Screening:  Vision Screening: Recommended annual ophthalmology exams for early detection of glaucoma and other disorders of the eye.  Dental Screening: Recommended annual dental exams for proper oral hygiene  Community Resource Referral / Chronic Care Management: CRR required this visit?  No   CCM required this visit?  No     Plan:     I have personally reviewed and noted the following in the patient's chart:   Medical and social history Use of alcohol, tobacco or illicit drugs  Current medications and supplements including opioid prescriptions. Patient is not currently taking opioid prescriptions. Functional ability and status Nutritional status Physical activity Advanced directives List of other physicians Hospitalizations, surgeries, and ER visits in previous 12 months Vitals Screenings to include cognitive, depression, and falls Referrals and appointments  In addition, I have reviewed and discussed with patient certain preventive protocols, quality metrics, and best practice recommendations. A written personalized care plan for preventive services as well as general preventive health recommendations were provided to patient.     Hal Hope, LPN   1/61/0960   After Visit Summary: (In Person-Declined) Patient declined AVS at this time.  Notes: Nothing significant to report at this time.

## 2023-06-11 NOTE — Patient Instructions (Addendum)
 [name] , Thank you for taking time to come for your Medicare Wellness Visit. I appreciate your ongoing commitment to your health goals. Please review the following plan we discussed and let me know if I can assist you in the future.   Referrals/Orders/Follow-Ups/Clinician Recommendations: NONE  This is a list of the screening recommended for you and due dates:  Health Maintenance  Topic Date Due   COVID-19 Vaccine (6 - 2024-25 season) 06/24/2023*   Medicare Annual Wellness Visit  06/10/2024   DTaP/Tdap/Td vaccine (2 - Td or Tdap) 02/26/2027   Colon Cancer Screening  09/20/2032   Pneumonia Vaccine  Completed   Flu Shot  Completed   Hepatitis C Screening  Completed   Zoster (Shingles) Vaccine  Completed   HPV Vaccine  Aged Out  *Topic was postponed. The date shown is not the original due date.    Advanced directives: (ACP Link)Information on Advanced Care Planning can be found at [facility] of [place] Advance Health Care Directives Advance Health Care Directives. [email or web address]   Next Medicare Annual Wellness Visit scheduled for next year: Yes   06/16/24 @ 2:40 IN PERSON

## 2023-06-14 ENCOUNTER — Ambulatory Visit
Admission: RE | Admit: 2023-06-14 | Discharge: 2023-06-14 | Disposition: A | Discharge status: Home / Self Care | Payer: Self-pay | Source: Ambulatory Visit | Attending: Family Medicine | Admitting: Family Medicine

## 2023-06-14 DIAGNOSIS — E7800 Pure hypercholesterolemia, unspecified: Secondary | ICD-10-CM | POA: Insufficient documentation

## 2023-06-14 DIAGNOSIS — I10 Essential (primary) hypertension: Secondary | ICD-10-CM | POA: Insufficient documentation

## 2023-06-16 ENCOUNTER — Encounter: Payer: Self-pay | Admitting: Family Medicine

## 2023-06-16 ENCOUNTER — Other Ambulatory Visit: Payer: Self-pay | Admitting: Family Medicine

## 2023-06-16 DIAGNOSIS — E7800 Pure hypercholesterolemia, unspecified: Secondary | ICD-10-CM

## 2023-06-18 DIAGNOSIS — Z4789 Encounter for other orthopedic aftercare: Secondary | ICD-10-CM | POA: Insufficient documentation

## 2023-06-18 NOTE — Telephone Encounter (Signed)
 Requested Prescriptions  Pending Prescriptions Disp Refills   lovastatin (MEVACOR) 40 MG tablet [Pharmacy Med Name: LOVASTATIN 40 MG TABLET] 90 tablet 0    Sig: TAKE 1 TABLET BY MOUTH EVERYDAY AT BEDTIME     Cardiovascular:  Antilipid - Statins 2 Failed - 06/18/2023  9:59 AM      Failed - Lipid Panel in normal range within the last 12 months    Cholesterol, Total  Date Value Ref Range Status  03/05/2020 168 100 - 199 mg/dL Final   Cholesterol  Date Value Ref Range Status  05/31/2023 172 <200 mg/dL Final   LDL Cholesterol (Calc)  Date Value Ref Range Status  05/31/2023 104 (H) mg/dL (calc) Final    Comment:    Reference range: <100 . Desirable range <100 mg/dL for primary prevention;   <70 mg/dL for patients with CHD or diabetic patients  with > or = 2 CHD risk factors. Marland Kitchen LDL-C is now calculated using the Martin-Hopkins  calculation, which is a validated novel method providing  better accuracy than the Friedewald equation in the  estimation of LDL-C.  Horald Pollen et al. Lenox Ahr. 4098;119(14): 2061-2068  (http://education.[facility].com/faq/FAQ164)    HDL  Date Value Ref Range Status  05/31/2023 48 > OR = 40 mg/dL Final  78/29/5621 50 >30 mg/dL Final   Triglycerides  Date Value Ref Range Status  05/31/2023 105 <150 mg/dL Final         Passed - Cr in normal range and within 360 days    Creat  Date Value Ref Range Status  05/31/2023 1.00 0.70 - 1.28 mg/dL Final         Passed - Patient is not pregnant      Passed - Valid encounter within last 12 months    Recent Outpatient Visits           1 week ago Annual physical exam   [place] [place], [place], [place]

## 2023-07-05 ENCOUNTER — Ambulatory Visit: Admitting: Podiatry

## 2023-07-14 ENCOUNTER — Ambulatory Visit: Admitting: Podiatry

## 2023-07-14 ENCOUNTER — Encounter: Payer: Self-pay | Admitting: Podiatry

## 2023-07-14 DIAGNOSIS — L603 Nail dystrophy: Secondary | ICD-10-CM | POA: Diagnosis not present

## 2023-07-14 DIAGNOSIS — D2371 Other benign neoplasm of skin of right lower limb, including hip: Secondary | ICD-10-CM | POA: Diagnosis not present

## 2023-07-14 NOTE — Progress Notes (Signed)
 Subjective:  Patient ID: Warren Jackson, male    DOB: March 29, 1950,  MRN: [id number] HPI Chief Complaint  Patient presents with   Nail Problem    1st left and 5th right - thick, discolored nails x years, tries to keep cut short, hallux nail is cracking, PCP said to use OTC drops but hasn't helped   Callouses    Sub 5th MPJ right - callused area, tender to walk at times   New Patient (Initial Visit)    Est pt 2016    73 y.o. male presents with the above complaint.   ROS: Denies fever chills nausea vomiting muscle aches pains calf pain back pain chest pain sense of breath.  Past Medical History:  Diagnosis Date   Allergy    Bone spur    Elevated PSA    Hernia 03/16/2012   Hyperlipidemia    Hypertension    Past Surgical History:  Procedure Laterality Date   BACK SURGERY  2008   COLONOSCOPY WITH PROPOFOL  N/A 09/21/2022   Procedure: COLONOSCOPY WITH PROPOFOL ;  Surgeon: Luke Salaam, MD;  Location: [facility] ENDOSCOPY;  Service: Gastroenterology;  Laterality: N/A;   HEMOSTASIS CLIP PLACEMENT  09/21/2022   Procedure: HEMOSTASIS CLIP PLACEMENT;  Surgeon: Luke Salaam, MD;  Location: [facility] ENDOSCOPY;  Service: Gastroenterology;;   KNEE ARTHROSCOPY Left    KNEE SURGERY Left [phone]   POLYPECTOMY  09/21/2022   Procedure: POLYPECTOMY;  Surgeon: Luke Salaam, MD;  Location: [facility] ENDOSCOPY;  Service: Gastroenterology;;   TONSILLECTOMY AND ADENOIDECTOMY      Current Outpatient Medications:    aspirin 81 MG tablet, Take 81 mg by mouth daily., Disp: , Rfl:    Cetirizine HCl (ZYRTEC ALLERGY PO), , Disp: , Rfl:    diphenhydrAMINE HCl, Sleep, 50 MG CAPS, Take 1 capsule by mouth at bedtime as needed., Disp: , Rfl:    lisinopril -hydrochlorothiazide  (ZESTORETIC ) 10-12.5 MG tablet, Take 1 tablet by mouth daily., Disp: 90 tablet, Rfl: 3   lovastatin  (MEVACOR ) 40 MG tablet, TAKE 1 TABLET BY MOUTH EVERYDAY AT BEDTIME, Disp: 90 tablet, Rfl: 0   methocarbamol  (ROBAXIN ) 500 MG tablet, TAKE 1 TABLET EVERY 6-8  HOURS BY ORAL ROUTE AS NEEDED FOR SPASM FOR 10 DAYS., Disp: , Rfl:    metoprolol  succinate (TOPROL -XL) 25 MG 24 hr tablet, TAKE 1 TABLET (25 MG TOTAL) BY MOUTH DAILY., Disp: 90 tablet, Rfl: 1   oxyCODONE (OXY IR/ROXICODONE) 5 MG immediate release tablet, TAKE 1 TABLET BY MOUTH EVERY 4 TO 6 HOURS AS NEEDED FOR PAIN FOR 7 DAYS, Disp: , Rfl:   Allergies  Allergen Reactions   Latex Other (See Comments)   Other     Telfa caused redness   Penicillins Rash   Review of Systems Objective:  There were no vitals filed for this visit.  General: Well developed, nourished, in no acute distress, alert and oriented x3   Dermatological: Skin is warm, dry and supple bilateral. Nails x 10 are well maintained; remaining integument appears unremarkable at this time. There are no open sores, no preulcerative lesions, no rash or signs of infection present.  Painful hyperkeratotic lesion subfifth metatarsal head of the right foot no open lesions no bleeding.  Appears to be palpated.  Fifth digit right foot hallux left foot demonstrates nail dystrophy possible onychomycosis.  Vascular: Dorsalis Pedis artery and Posterior Tibial artery pedal pulses are 2/4 bilateral with immedate capillary fill time. Pedal hair growth present. No varicosities and no lower extremity edema present bilateral.   Neruologic: Grossly intact  via light touch bilateral. Vibratory intact via tuning fork bilateral. Protective threshold with Semmes Wienstein monofilament intact to all pedal sites bilateral. Patellar and Achilles deep tendon reflexes 2+ bilateral. No Babinski or clonus noted bilateral.   Musculoskeletal: No gross boney pedal deformities bilateral. No pain, crepitus, or limitation noted with foot and ankle range of motion bilateral. Muscular strength 5/5 in all groups tested bilateral.  Gait: Unassisted, Nonantalgic.    Radiographs:  None taken  Assessment & Plan:   Assessment: Nail dystrophy fifth right first left benign  skin lesion subfifth met head right painful.  Plan: Debrided benign skin lesion today.  Took samples of the aforementioned nails for pathologic evaluation.     Warren Jackson, [place]

## 2023-08-11 ENCOUNTER — Ambulatory Visit: Admitting: Podiatry

## 2023-08-11 ENCOUNTER — Encounter: Payer: Self-pay | Admitting: Podiatry

## 2023-08-11 DIAGNOSIS — L603 Nail dystrophy: Secondary | ICD-10-CM | POA: Diagnosis not present

## 2023-08-11 MED ORDER — TERBINAFINE HCL 250 MG PO TABS: 250.0000 mg | ORAL_TABLET | Freq: Every day | ORAL | 0 refills | Status: DC

## 2023-08-11 NOTE — Progress Notes (Signed)
 He presents today for follow-up of his pathology to his toenails.  Objective: Vital signs are stable alert oriented x 3.  Positive onychomycosis Trichophyton rubrum via nail pathology and PCR testing.  Also demonstrates nail dystrophy.  Assessment: Onychomycosis.  Plan: Discussed topical therapy oral therapy laser therapy.  Also discussed doing nothing.  At this point he would like to try topical therapy due to its higher chance of clearing the onychomycosis.  We did discuss the pros and cons of this medication possible side effects associated with it.  He understands this is amenable to it we will start him on 30 days of Lamisil 250 mg tablets he will take 1 tablet daily and I will follow-up with him in 30 days for blood work.

## 2023-09-07 ENCOUNTER — Encounter: Payer: Self-pay | Admitting: Podiatry

## 2023-09-07 DIAGNOSIS — Z79899 Other long term (current) drug therapy: Secondary | ICD-10-CM

## 2023-09-07 DIAGNOSIS — L603 Nail dystrophy: Secondary | ICD-10-CM

## 2023-09-09 LAB — COMPREHENSIVE METABOLIC PANEL WITH GFR
ALT: 16 IU/L (ref 0–44)
AST: 24 IU/L (ref 0–40)
Albumin: 4.6 g/dL (ref 3.8–4.8)
Alkaline Phosphatase: 68 IU/L (ref 44–121)
BUN/Creatinine Ratio: 23 (ref 10–24)
BUN: 27 mg/dL (ref 8–27)
Bilirubin Total: 0.8 mg/dL (ref 0.0–1.2)
CO2: 24 mmol/L (ref 20–29)
Calcium: 10.1 mg/dL (ref 8.6–10.2)
Chloride: 100 mmol/L (ref 96–106)
Creatinine, Ser: 1.19 mg/dL (ref 0.76–1.27)
Globulin, Total: 2.2 g/dL (ref 1.5–4.5)
Glucose: 92 mg/dL (ref 70–99)
Potassium: 4.4 mmol/L (ref 3.5–5.2)
Sodium: 140 mmol/L (ref 134–144)
Total Protein: 6.8 g/dL (ref 6.0–8.5)
eGFR: 65 mL/min/{1.73_m2} (ref >59)

## 2023-09-10 ENCOUNTER — Other Ambulatory Visit: Payer: Self-pay | Admitting: Podiatry

## 2023-09-12 ENCOUNTER — Other Ambulatory Visit: Payer: Self-pay | Admitting: Family Medicine

## 2023-09-12 DIAGNOSIS — E7800 Pure hypercholesterolemia, unspecified: Secondary | ICD-10-CM

## 2023-09-14 NOTE — Telephone Encounter (Signed)
 Requested Prescriptions  Pending Prescriptions Disp Refills   lovastatin  (MEVACOR ) 40 MG tablet [Pharmacy Med Name: LOVASTATIN  40 MG TABLET] 90 tablet 0    Sig: TAKE 1 TABLET BY MOUTH EVERYDAY AT BEDTIME     Cardiovascular:  Antilipid - Statins 2 Failed - 09/14/2023 11:34 AM      Failed - Lipid Panel in normal range within the last 12 months    Cholesterol, Total  Date Value Ref Range Status  03/05/2020 168 100 - 199 mg/dL Final   Cholesterol  Date Value Ref Range Status  05/31/2023 172 <200 mg/dL Final   LDL Cholesterol (Calc)  Date Value Ref Range Status  05/31/2023 104 (H) mg/dL (calc) Final    Comment:    Reference range: <100 . Desirable range <100 mg/dL for primary prevention;   <70 mg/dL for patients with CHD or diabetic patients  with > or = 2 CHD risk factors. SABRA LDL-C is now calculated using the Martin-Hopkins  calculation, which is a validated novel method providing  better accuracy than the Friedewald equation in the  estimation of LDL-C.  Gladis APPLETHWAITE et al. SANDREA. 7986;689(80): 2061-2068  (http://education.[facility].com/faq/FAQ164)    HDL  Date Value Ref Range Status  05/31/2023 48 > OR = 40 mg/dL Final  87/78/7978 50 >60 mg/dL Final   Triglycerides  Date Value Ref Range Status  05/31/2023 105 <150 mg/dL Final         Passed - Cr in normal range and within 360 days    Creat  Date Value Ref Range Status  05/31/2023 1.00 0.70 - 1.28 mg/dL Final   Creatinine, Ser  Date Value Ref Range Status  09/08/2023 1.19 0.76 - 1.27 mg/dL Final         Passed - Patient is not pregnant      Passed - Valid encounter within last 12 months    Recent Outpatient Visits           3 months ago Annual physical exam   [place] [facility] [place] PARAS, DO

## 2023-09-15 ENCOUNTER — Other Ambulatory Visit: Payer: Self-pay | Admitting: Family Medicine

## 2023-09-15 DIAGNOSIS — I10 Essential (primary) hypertension: Secondary | ICD-10-CM

## 2023-09-16 ENCOUNTER — Other Ambulatory Visit: Payer: Self-pay

## 2023-09-16 DIAGNOSIS — I10 Essential (primary) hypertension: Secondary | ICD-10-CM

## 2023-09-16 MED ORDER — METOPROLOL SUCCINATE ER 25 MG PO TB24: 25.0000 mg | ORAL_TABLET | Freq: Every day | ORAL | 1 refills | Status: DC

## 2023-09-17 NOTE — Telephone Encounter (Signed)
 Requested Prescriptions  Refused Prescriptions Disp Refills   metoprolol  succinate (TOPROL -XL) 25 MG 24 hr tablet [Pharmacy Med Name: METOPROLOL  SUCC ER 25 MG TAB] 90 tablet 1    Sig: TAKE 1 TABLET (25 MG TOTAL) BY MOUTH DAILY.     Cardiovascular:  Beta Blockers Passed - 09/17/2023  6:15 PM      Passed - Last BP in normal range    BP Readings from Last 1 Encounters:  06/11/23 120/78         Passed - Last Heart Rate in normal range    Pulse Readings from Last 1 Encounters:  06/08/23 72         Passed - Valid encounter within last 6 months    Recent Outpatient Visits           3 months ago Annual physical exam   [place] [facility] [place], [place] PARAS, [place]

## 2023-10-07 ENCOUNTER — Other Ambulatory Visit: Payer: Self-pay

## 2023-10-07 MED ORDER — TERBINAFINE HCL 250 MG PO TABS: 250.0000 mg | ORAL_TABLET | Freq: Every day | ORAL | 0 refills | Status: DC

## 2023-11-04 ENCOUNTER — Other Ambulatory Visit: Payer: Self-pay | Admitting: Podiatry

## 2023-11-11 ENCOUNTER — Encounter: Payer: Self-pay | Admitting: Podiatry

## 2023-11-11 ENCOUNTER — Ambulatory Visit: Admitting: Podiatry

## 2023-11-11 DIAGNOSIS — L603 Nail dystrophy: Secondary | ICD-10-CM

## 2023-11-11 MED ORDER — TERBINAFINE HCL 250 MG PO TABS: 250.0000 mg | ORAL_TABLET | Freq: Every day | ORAL | 0 refills | Status: AC

## 2023-11-11 NOTE — Progress Notes (Signed)
     Warren Jackson presents today for follow-up of his onychomycosis he is completed 90 days of Lamisil .  He is approximately 50% grown out he says.  Denies any problems taking medication fever chills nausea mobic muscle aches pains calf pain back pain chest pain shortness of breath itching or rashes.  Objective: Vital signs stable oriented x 3 complete resolution of tinea pedis onychomycosis it is growing out about 50% of the hallux left.  Assessment: Resolving onychomycosis with long-term therapy resolving tinea pedis.  Plan: Discussed etiology pathology conservative therapies at this point: Dispensed another 90 tablets of Lamisil  250 mg.  Recently had blood work which demonstrated normal liver levels.  I will follow-up with him in 4 months.

## 2023-12-06 ENCOUNTER — Other Ambulatory Visit

## 2023-12-06 DIAGNOSIS — R7989 Other specified abnormal findings of blood chemistry: Secondary | ICD-10-CM

## 2023-12-06 DIAGNOSIS — R972 Elevated prostate specific antigen [PSA]: Secondary | ICD-10-CM | POA: Diagnosis not present

## 2023-12-06 DIAGNOSIS — E7800 Pure hypercholesterolemia, unspecified: Secondary | ICD-10-CM

## 2023-12-06 DIAGNOSIS — R7309 Other abnormal glucose: Secondary | ICD-10-CM | POA: Diagnosis not present

## 2023-12-06 DIAGNOSIS — N401 Enlarged prostate with lower urinary tract symptoms: Secondary | ICD-10-CM

## 2023-12-06 DIAGNOSIS — R351 Nocturia: Secondary | ICD-10-CM | POA: Diagnosis not present

## 2023-12-07 ENCOUNTER — Ambulatory Visit: Payer: Self-pay | Admitting: Family Medicine

## 2023-12-07 LAB — T4, FREE: Free T4: 1.3 ng/dL (ref 0.8–1.8)

## 2023-12-07 LAB — HEMOGLOBIN A1C
Hgb A1c MFr Bld: 5.6 % (ref <5.7)
Mean Plasma Glucose: 114 mg/dL
eAG (mmol/L): 6.3 mmol/L

## 2023-12-07 LAB — PSA: PSA: 5.47 ng/mL — ABNORMAL HIGH (ref <=4.00)

## 2023-12-10 ENCOUNTER — Ambulatory Visit: Admitting: Family Medicine

## 2023-12-12 ENCOUNTER — Other Ambulatory Visit: Payer: Self-pay | Admitting: Family Medicine

## 2023-12-12 DIAGNOSIS — E7800 Pure hypercholesterolemia, unspecified: Secondary | ICD-10-CM

## 2023-12-14 NOTE — Telephone Encounter (Signed)
 Requested Prescriptions  Pending Prescriptions Disp Refills   lovastatin  (MEVACOR ) 40 MG tablet [Pharmacy Med Name: LOVASTATIN  40 MG TABLET] 90 tablet 1    Sig: TAKE 1 TABLET BY MOUTH EVERYDAY AT BEDTIME     Cardiovascular:  Antilipid - Statins 2 Failed - 12/14/2023  1:32 PM      Failed - Lipid Panel in normal range within the last 12 months    Cholesterol, Total  Date Value Ref Range Status  03/05/2020 168 100 - 199 mg/dL Final   Cholesterol  Date Value Ref Range Status  05/31/2023 172 <200 mg/dL Final   LDL Cholesterol (Calc)  Date Value Ref Range Status  05/31/2023 104 (H) mg/dL (calc) Final    Comment:    Reference range: <100 . Desirable range <100 mg/dL for primary prevention;   <70 mg/dL for patients with CHD or diabetic patients  with > or = 2 CHD risk factors. SABRA LDL-C is now calculated using the Martin-Hopkins  calculation, which is a validated novel method providing  better accuracy than the Friedewald equation in the  estimation of LDL-C.  Gladis APPLETHWAITE et al. SANDREA. 7986;689(80): 2061-2068  (http://education.[facility].com/faq/FAQ164)    HDL  Date Value Ref Range Status  05/31/2023 48 > OR = 40 mg/dL Final  87/78/7978 50 >60 mg/dL Final   Triglycerides  Date Value Ref Range Status  05/31/2023 105 <150 mg/dL Final         Passed - Cr in normal range and within 360 days    Creat  Date Value Ref Range Status  05/31/2023 1.00 0.70 - 1.28 mg/dL Final   Creatinine, Ser  Date Value Ref Range Status  09/08/2023 1.19 0.76 - 1.27 mg/dL Final         Passed - Patient is not pregnant      Passed - Valid encounter within last 12 months    Recent Outpatient Visits           6 months ago Annual physical exam   [place] [facility] [place] Marsa PARAS, DO

## 2023-12-15 ENCOUNTER — Other Ambulatory Visit: Payer: Self-pay | Admitting: Family Medicine

## 2023-12-15 ENCOUNTER — Encounter: Payer: Self-pay | Admitting: Family Medicine

## 2023-12-15 ENCOUNTER — Ambulatory Visit: Admitting: Family Medicine

## 2023-12-15 VITALS — BP 134/80 | HR 59 | Ht 69.0 in | Wt 182.2 lb

## 2023-12-15 DIAGNOSIS — R7309 Other abnormal glucose: Secondary | ICD-10-CM

## 2023-12-15 DIAGNOSIS — R351 Nocturia: Secondary | ICD-10-CM

## 2023-12-15 DIAGNOSIS — R972 Elevated prostate specific antigen [PSA]: Secondary | ICD-10-CM

## 2023-12-15 DIAGNOSIS — Z23 Encounter for immunization: Secondary | ICD-10-CM

## 2023-12-15 DIAGNOSIS — E7800 Pure hypercholesterolemia, unspecified: Secondary | ICD-10-CM | POA: Diagnosis not present

## 2023-12-15 DIAGNOSIS — I10 Essential (primary) hypertension: Secondary | ICD-10-CM | POA: Diagnosis not present

## 2023-12-15 DIAGNOSIS — N401 Enlarged prostate with lower urinary tract symptoms: Secondary | ICD-10-CM

## 2023-12-15 DIAGNOSIS — Z0000 Encounter for general adult medical examination without abnormal findings: Secondary | ICD-10-CM

## 2023-12-15 DIAGNOSIS — R7989 Other specified abnormal findings of blood chemistry: Secondary | ICD-10-CM

## 2023-12-15 NOTE — Patient Instructions (Addendum)
 Thank you for coming to the office today.  We can pause on the Gene  DUE for FASTING BLOOD WORK (no food or drink after midnight before the lab appointment, only water or coffee without cream/sugar on the morning of)  SCHEDULE Lab Only visit in the morning at the clinic for lab draw in 6 MONTHS   - Make sure Lab Only appointment is at about 1 week before your next appointment, so that results will be available  For Lab Results, once available within 2-3 days of blood draw, you can can log in to MyChart online to view your results and a brief explanation. Also, we can discuss results at next follow-up visit.  Please schedule a Follow-up Appointment to: Return in about 6 months (around 06/14/2024) for 6 month fasting lab > 1 week later Annual Physical.  If you have any other questions or concerns, please feel free to call the office or send a message through MyChart. You may also schedule an earlier appointment if necessary.  Additionally, you may be receiving a survey about your experience at our office within a few days to 1 week by e-mail or mail. We value your feedback.  Marsa Officer, DO [facility], [place]

## 2023-12-15 NOTE — Progress Notes (Signed)
 Subjective:    Patient ID: Warren Jackson, male    DOB: 1950-04-25, 73 y.o.   MRN: [id number]  Warren Jackson is a 73 y.o. male presenting on 12/15/2023 for Medical Management of Chronic Issues   HPI  Discussed the use of AI scribe software for clinical note transcription with the patient, who gave verbal consent to proceed.  History of Present Illness   Warren Jackson is a 73 year old male who presents for a follow-up visit to discuss medication use and recent health screenings.   Elevated PSA BPH Prior diagnosis since 1991 Last PSA trend 5.47 (11/2023, current) elevated from 5.23 (05/2023) prior trend 4-5.23 in past He has been experiencing fluctuating PSA levels, with values oscillating between 4 and 5. Despite these fluctuations, there have been no significant changes in urinary habits.  Previously on [place]metto, limited results, discontinued Not on medication No prior prostate biopsy. He does well during day Seems to only have prostate BPH symptoms at night Nocturia 1 to 3 x overnight    CHRONIC HTN: Current Meds - Lisinopril -HCTZ 10-12.5mg  every other day, Metoprolol  XL 25mg  daily   Reports good compliance. Tolerating well, w/o complaints. Walking daily for exercise Admits postural dizziness  Denies CP, dyspnea, HA, edema, dizziness / lightheadedness  He is off ASA aspirin 81mg      Elevated A1c Last lab A1c 5.6, improved. Previously 5.7 to 5.9 No new concerns. He has fam history of grandparent with DM Goal to keep improving diet. He has mostly eliminated sweets snacks and increasing fruit etc Continues with exercise regimen   Elevated TSH He has a history of thyroid  issues, with a slightly elevated TSH level noted two to four years ago. Last lab normal Free T4 Not on therapy    Health Maintenance:  Flu Shot today  Defer COVID Vaccine     06/11/2023    2:39 PM 06/08/2023    9:17 AM 12/29/2022    8:45 AM  Depression screen PHQ 2/9  Decreased  Interest 0 0 0  Down, Depressed, Hopeless 0 0 0  PHQ - 2 Score 0 0 0  Altered sleeping 0 1 1  Tired, decreased energy 0 0 0  Change in appetite 0 0 0  Feeling bad or failure about yourself  0 0 0  Trouble concentrating 0 0 0  Moving slowly or fidgety/restless 0 0 0  Suicidal thoughts 0 0 0  PHQ-9 Score 0 1 1  Difficult doing work/chores Not difficult at all Not difficult at all Not difficult at all       06/08/2023    9:17 AM 12/29/2022    8:45 AM 05/13/2022    9:34 AM  GAD 7 : Generalized Anxiety Score  Nervous, Anxious, on Edge 0 0 0  Control/stop worrying 0 0 0  Worry too much - different things 0 0 0  Trouble relaxing 0 0 0  Restless 0 0 0  Easily annoyed or irritable 0 0 0  Afraid - awful might happen 0 0 0  Total GAD 7 Score 0 0 0  Anxiety Difficulty   Not difficult at all    Social History   Tobacco Use   Smoking status: Never   Smokeless tobacco: Never  Vaping Use   Vaping status: Never Used  Substance Use Topics   Alcohol use: Yes    Comment: rarely   Drug use: No    Review of Systems Per HPI unless specifically indicated above  Objective:    BP 134/80 (BP Location: Right Arm, Patient Position: Sitting, Cuff Size: Normal)   Pulse (!) 59   Ht 5' 9 (1.753 m)   Wt 182 lb 4 oz (82.7 kg)   SpO2 97%   BMI 26.91 kg/m   Wt Readings from Last 3 Encounters:  12/15/23 182 lb 4 oz (82.7 kg)  06/11/23 174 lb 6.4 oz (79.1 kg)  06/08/23 173 lb (78.5 kg)    Physical Exam Vitals and nursing note reviewed.  Constitutional:      General: He is not in acute distress.    Appearance: Normal appearance. He is well-developed. He is not diaphoretic.     Comments: Well-appearing, comfortable, cooperative  HENT:     Head: Normocephalic and atraumatic.  Eyes:     General:        Right eye: No discharge.        Left eye: No discharge.     Conjunctiva/sclera: Conjunctivae normal.  Cardiovascular:     Rate and Rhythm: Normal rate.  Pulmonary:     Effort:  Pulmonary effort is normal.  Skin:    General: Skin is warm and dry.     Findings: No erythema or rash.  Neurological:     Mental Status: He is alert and oriented to person, place, and time.  Psychiatric:        Mood and Affect: Mood normal.        Behavior: Behavior normal.        Thought Content: Thought content normal.     Comments: Well groomed, good eye contact, normal speech and thoughts     I have personally reviewed the radiology report from 06/14/23 on CT Coronary Calcium.  CLINICAL DATA:  Risk stratification   EXAM: Coronary Calcium Score   TECHNIQUE: The patient was scanned on a Siemens Somatom scanner. Axial non-contrast 3 mm slices were carried out through the heart. The data set was analyzed on a dedicated work station and scored using the Agatson method.   FINDINGS: Non-cardiac: See separate report from [facility].   Ascending Aorta: Normal size   Pericardium: Normal   Coronary arteries: Normal origin of left and right coronary arteries. Distribution of arterial calcifications if present, as noted below;   LM 0   LAD 65.4   LCx 10.7   RCA 5.19   Total 81.3   IMPRESSION AND RECOMMENDATION: 1. Coronary calcium score of 81.3. This was 38th percentile for age and sex matched control.   2. CAC 1-99 in LAD, LCx, RCA. CAC-DRS A1/N3.   3. Continue heart healthy lifestyle and risk factor modification.   Electronically Signed: By: Redell Cave M.D. On: 06/14/2023 08:52   Results for orders placed or performed in visit on 12/06/23  T4, free   Collection Time: 12/06/23  8:53 AM  Result Value Ref Range   Free T4 1.3 0.8 - 1.8 ng/dL  PSA   Collection Time: 12/06/23  8:53 AM  Result Value Ref Range   PSA 5.47 (H) < OR = 4.00 ng/mL  Hemoglobin A1c   Collection Time: 12/06/23  8:53 AM  Result Value Ref Range   Hgb A1c MFr Bld 5.6 <5.7 %   Mean Plasma Glucose 114 mg/dL   eAG (mmol/L) 6.3 mmol/L      Assessment & Plan:    Problem List Items Addressed This Visit     Benign prostatic hyperplasia with nocturia - Primary   Essential (primary) hypertension   Hypercholesterolemia without hypertriglyceridemia  Other Visit Diagnoses       Flu vaccine need       Relevant Orders   Flu vaccine HIGH DOSE PF(Fluzone Trivalent) (Completed)     Elevated hemoglobin A1c            Elevated PSA Benign prostatic hyperplasia Chronic BPH with PSA elevated 5.47 (from 5.23, prior 4.40, 5.2, 4.3) He has chronic history of prior prostate evaluation in past see HPI, previously diagnosed with BPH as explanation for elevated PSA has been in 4-6 range for years. Discussion today and mutual agreement to continue to observe - Repeat PSA test in 6 months. - Consider urology consult if PSA levels increase significantly, >6  Elevated A1c Improved to 5.6 from 5.7 Encourage lifestyle modification  Borderline thyroid  function tests Borderline TSH fluctuations, stable T4, no symptoms. - Continue monitoring thyroid  function tests as part of routine blood work.  Overweight BMI 26, engages in regular physical activity and dietary modifications. - Continue current exercise regimen. - Encourage balanced diet with increased fruits and vegetables.  Aspirin use No cardiovascular history, low risk profile, aspirin discontinued. - Discontinue aspirin unless future cardiovascular events or new research suggest otherwise.  General Health Maintenance Received flu vaccination, discussed multivitamin benefits. - Consider multivitamin supplementation, particularly age-based formulations       Orders Placed This Encounter  Procedures   Flu vaccine HIGH DOSE PF(Fluzone Trivalent)    No orders of the defined types were placed in this encounter.   Follow up plan: Return in about 6 months (around 06/14/2024) for 6 month fasting lab > 1 week later Annual Physical.  Future labs ordered for  6 month    Marsa Officer,  DO [facility] 12/15/2023, 8:32 AM

## 2024-02-28 ENCOUNTER — Ambulatory Visit: Admitting: Podiatry

## 2024-02-28 DIAGNOSIS — Z79899 Other long term (current) drug therapy: Secondary | ICD-10-CM

## 2024-02-28 DIAGNOSIS — B351 Tinea unguium: Secondary | ICD-10-CM

## 2024-02-28 MED ORDER — TERBINAFINE HCL 250 MG PO TABS: 250.0000 mg | ORAL_TABLET | Freq: Every day | ORAL | 0 refills | Status: AC

## 2024-02-28 NOTE — Progress Notes (Signed)
 Warren Jackson presents today for his 18-month follow-up after completing 120 days of Lamisil  therapy.  He states he is very satisfied with the outcome.  States that he notices it has grown out considerably.  Has no problem taking the medication no stomach upset no or rashes or itching.  Objective: Vital signs stable alert and oriented x 3.  Pulses are palpable.  His nail growth does demonstrate approximately 85 to 94% improvement of the hallux.  Assessment: Long-term therapy with Lamisil  for onychomycosis.  Plan: I think 1 more round of Lamisil  30 tablets 1 p.o. q. OD will probably do the trick for him if he notices any regression he will notify us  we did send that over electronically today for him.

## 2024-03-07 ENCOUNTER — Other Ambulatory Visit: Payer: Self-pay | Admitting: Family Medicine

## 2024-03-07 DIAGNOSIS — I10 Essential (primary) hypertension: Secondary | ICD-10-CM

## 2024-03-08 NOTE — Telephone Encounter (Signed)
 Requested Prescriptions  Pending Prescriptions Disp Refills   lisinopril -hydrochlorothiazide  (ZESTORETIC ) 10-12.5 MG tablet [Pharmacy Med Name: LISINOPRIL -HCTZ 10-12.5 MG TAB] 90 tablet 3    Sig: TAKE 1 TABLET BY MOUTH EVERY DAY     Cardiovascular:  ACEI + Diuretic Combos Failed - 03/08/2024  4:24 PM      Failed - Na in normal range and within 180 days    Sodium  Date Value Ref Range Status  09/08/2023 140 134 - 144 mmol/L Final         Failed - K in normal range and within 180 days    Potassium  Date Value Ref Range Status  09/08/2023 4.4 3.5 - 5.2 mmol/L Final         Failed - Cr in normal range and within 180 days    Creat  Date Value Ref Range Status  05/31/2023 1.00 0.70 - 1.28 mg/dL Final   Creatinine, Ser  Date Value Ref Range Status  09/08/2023 1.19 0.76 - 1.27 mg/dL Final         Failed - eGFR is 30 or above and within 180 days    GFR calc Af Amer  Date Value Ref Range Status  03/05/2020 67 >59 mL/min/1.73 Final    Comment:    **In accordance with recommendations from the NKF-ASN Task force,**   [facility] is in the process of updating its eGFR calculation to the   2021 CKD-EPI creatinine equation that estimates kidney function   without a race variable.    GFR calc non Af Amer  Date Value Ref Range Status  03/05/2020 58 (L) >59 mL/min/1.73 Final   eGFR  Date Value Ref Range Status  09/08/2023 65 >59 mL/min/1.73 Final         Passed - Patient is not pregnant      Passed - Last BP in normal range    BP Readings from Last 1 Encounters:  12/15/23 134/80         Passed - Valid encounter within last 6 months    Recent Outpatient Visits           2 months ago Benign prostatic hyperplasia with nocturia   [place] [facility] [place] Marsa PARAS, DO   9 months ago Annual physical exam   [place] [facility] [place], Marsa PARAS, DO               metoprolol  succinate (TOPROL -XL) 25 MG 24 hr tablet  [Pharmacy Med Name: METOPROLOL  SUCC ER 25 MG TAB] 90 tablet 1    Sig: TAKE 1 TABLET (25 MG TOTAL) BY MOUTH DAILY.     Cardiovascular:  Beta Blockers Passed - 03/08/2024  4:24 PM      Passed - Last BP in normal range    BP Readings from Last 1 Encounters:  12/15/23 134/80         Passed - Last Heart Rate in normal range    Pulse Readings from Last 1 Encounters:  12/15/23 (!) 59         Passed - Valid encounter within last 6 months    Recent Outpatient Visits           2 months ago Benign prostatic hyperplasia with nocturia    [facility] [place], Marsa PARAS, DO   9 months ago Annual physical exam   [facility] [place], Marsa PARAS, [place]

## 2024-06-05 ENCOUNTER — Ambulatory Visit: Admitting: Podiatry

## 2024-06-06 ENCOUNTER — Other Ambulatory Visit

## 2024-06-16 ENCOUNTER — Encounter

## 2024-06-20 ENCOUNTER — Encounter: Admitting: Family Medicine

## 2024-06-23 ENCOUNTER — Encounter

## 2024-06-28 ENCOUNTER — Encounter
